# Patient Record
Sex: Male | Born: 1950
Health system: Southern US, Community
[De-identification: ages and names within clinical notes are randomized; demographics above are authoritative.]

## PROBLEM LIST (undated history)

## (undated) DIAGNOSIS — E119 Type 2 diabetes mellitus without complications: Secondary | ICD-10-CM

## (undated) DIAGNOSIS — R350 Frequency of micturition: Secondary | ICD-10-CM

## (undated) DIAGNOSIS — K7460 Unspecified cirrhosis of liver: Secondary | ICD-10-CM

## (undated) DIAGNOSIS — K219 Gastro-esophageal reflux disease without esophagitis: Secondary | ICD-10-CM

## (undated) DIAGNOSIS — E785 Hyperlipidemia, unspecified: Secondary | ICD-10-CM

## (undated) DIAGNOSIS — C649 Malignant neoplasm of unspecified kidney, except renal pelvis: Secondary | ICD-10-CM

## (undated) DIAGNOSIS — R29898 Other symptoms and signs involving the musculoskeletal system: Secondary | ICD-10-CM

## (undated) DIAGNOSIS — C61 Malignant neoplasm of prostate: Secondary | ICD-10-CM

## (undated) DIAGNOSIS — F32A Depression, unspecified: Secondary | ICD-10-CM

## (undated) DIAGNOSIS — I10 Essential (primary) hypertension: Secondary | ICD-10-CM

## (undated) DIAGNOSIS — G1221 Amyotrophic lateral sclerosis: Secondary | ICD-10-CM

## (undated) HISTORY — PX: PROSTATE BIOPSY: SHX241

## (undated) HISTORY — PX: ROTATOR CUFF REPAIR: SHX139

## (undated) HISTORY — DX: Type 2 diabetes mellitus without complications: E11.9

## (undated) HISTORY — DX: Depression, unspecified: F32.A

## (undated) HISTORY — PX: ADENOIDECTOMY, TONSILLECTOMY AND MYRINGOTOMY WITH TUBE PLACEMENT: SHX5716

## (undated) HISTORY — DX: Essential (primary) hypertension: I10

## (undated) HISTORY — DX: Hyperlipidemia, unspecified: E78.5

---

## 1992-04-08 HISTORY — PX: ROTATOR CUFF REPAIR: SHX139

## 2000-04-09 ENCOUNTER — Ambulatory Visit (HOSPITAL_BASED_OUTPATIENT_CLINIC_OR_DEPARTMENT_OTHER): Admission: RE | Admit: 2000-04-09 | Discharge: 2000-04-10 | Payer: Self-pay | Admitting: Orthopedic Surgery

## 2011-10-08 ENCOUNTER — Encounter: Payer: Self-pay | Admitting: Emergency Medicine

## 2011-10-08 ENCOUNTER — Ambulatory Visit (INDEPENDENT_AMBULATORY_CARE_PROVIDER_SITE_OTHER): Payer: BLUE CROSS/BLUE SHIELD | Admitting: Emergency Medicine

## 2011-10-08 VITALS — BP 139/81 | HR 76 | Temp 99.2°F | Resp 12 | Ht 71.5 in | Wt 223.6 lb

## 2011-10-08 DIAGNOSIS — E785 Hyperlipidemia, unspecified: Secondary | ICD-10-CM

## 2011-10-08 DIAGNOSIS — I10 Essential (primary) hypertension: Secondary | ICD-10-CM

## 2011-10-08 DIAGNOSIS — R945 Abnormal results of liver function studies: Secondary | ICD-10-CM

## 2011-10-08 DIAGNOSIS — R7989 Other specified abnormal findings of blood chemistry: Secondary | ICD-10-CM

## 2011-10-08 DIAGNOSIS — E782 Mixed hyperlipidemia: Secondary | ICD-10-CM

## 2011-10-08 LAB — COMPREHENSIVE METABOLIC PANEL
ALT: 61 U/L — ABNORMAL HIGH (ref 0–53)
AST: 50 U/L — ABNORMAL HIGH (ref 0–37)
Albumin: 4.3 g/dL (ref 3.5–5.2)
Alkaline Phosphatase: 58 U/L (ref 39–117)
BUN: 21 mg/dL (ref 6–23)
Calcium: 9.2 mg/dL (ref 8.4–10.5)
Chloride: 105 mEq/L (ref 96–112)
Creat: 0.98 mg/dL (ref 0.50–1.35)
Potassium: 3.7 mEq/L (ref 3.5–5.3)

## 2011-10-08 LAB — CBC WITH DIFFERENTIAL/PLATELET
Basophils Absolute: 0 10*3/uL (ref 0.0–0.1)
Basophils Relative: 1 % (ref 0–1)
Lymphocytes Relative: 28 % (ref 12–46)
MCHC: 35.7 g/dL (ref 30.0–36.0)
Monocytes Absolute: 0.6 10*3/uL (ref 0.1–1.0)
Neutro Abs: 5 10*3/uL (ref 1.7–7.7)
Platelets: 159 10*3/uL (ref 150–400)
RDW: 13.3 % (ref 11.5–15.5)
WBC: 8.3 10*3/uL (ref 4.0–10.5)

## 2011-10-08 LAB — LIPID PANEL
HDL: 30 mg/dL — ABNORMAL LOW (ref >39)
Total CHOL/HDL Ratio: 5.2 Ratio

## 2011-10-08 NOTE — Progress Notes (Signed)
  Subjective:    Patient ID: Albert Andrews, male    DOB: July 27, 1950, 61 y.o.   MRN: [id number]  HPI patient enters today for follow up. He was seen in November and found to have elevated liver function tests. He is currently on lisinopril HCTZ and simvastatin. He has not been very good about his diet in fact today he had 3 pieces of pizza before he came in. He does not drink or regular basis but does drink whenever he plays golf. He denies any abdominal pain. He denies any nausea or vomiting. He did see Dr. Audley Hose and have a colonoscopy. His regular checkups with the urologist regarding his prostate.    Review of Systems     Objective:   Physical Exam  Constitutional: He appears well-developed and well-nourished.  HENT:  Head: Normocephalic.  Eyes: Pupils are equal, round, and reactive to light.  Neck: No tracheal deviation present. No thyromegaly present.  Cardiovascular: Normal rate, regular rhythm, normal heart sounds and intact distal pulses.  Exam reveals no gallop.   No murmur heard. Pulmonary/Chest: No respiratory distress. He has no wheezes. He has no rales. He exhibits no tenderness.  Abdominal: He exhibits no distension and no mass. There is no tenderness. There is no rebound and no guarding.  Skin: Skin is warm and dry.          Assessment & Plan:  Patient has somewhat ruddy complected but he denies heavy alcohol intake. He does have high cholesterol has not been following diet his last cholesterol was significantly elevated . He certainly could have fatty change in the liver. We'll repeat his liver function tests. I may change him from simvastatin to Lipitor. I scheduled him for a ultrasound of the abdomen attention liver.

## 2011-10-18 ENCOUNTER — Other Ambulatory Visit: Payer: Self-pay | Admitting: Emergency Medicine

## 2011-10-18 ENCOUNTER — Ambulatory Visit
Admission: RE | Admit: 2011-10-18 | Discharge: 2011-10-18 | Disposition: A | Discharge status: Home / Self Care | Payer: BLUE CROSS/BLUE SHIELD | Source: Ambulatory Visit | Attending: Emergency Medicine | Admitting: Emergency Medicine

## 2011-10-18 DIAGNOSIS — K8689 Other specified diseases of pancreas: Secondary | ICD-10-CM

## 2011-10-18 DIAGNOSIS — R945 Abnormal results of liver function studies: Secondary | ICD-10-CM

## 2011-10-18 DIAGNOSIS — R7989 Other specified abnormal findings of blood chemistry: Secondary | ICD-10-CM

## 2011-10-22 ENCOUNTER — Other Ambulatory Visit: Payer: Self-pay | Admitting: *Deleted

## 2011-10-22 DIAGNOSIS — K8689 Other specified diseases of pancreas: Secondary | ICD-10-CM

## 2011-10-28 ENCOUNTER — Ambulatory Visit
Admission: RE | Admit: 2011-10-28 | Discharge: 2011-10-28 | Disposition: A | Discharge status: Home / Self Care | Payer: BLUE CROSS/BLUE SHIELD | Source: Ambulatory Visit | Attending: Emergency Medicine | Admitting: Emergency Medicine

## 2011-10-28 DIAGNOSIS — K8689 Other specified diseases of pancreas: Secondary | ICD-10-CM

## 2011-10-28 MED ORDER — IOHEXOL 300 MG/ML  SOLN
125.0000 mL | Freq: Once | INTRAMUSCULAR | Status: AC | PRN
  Administered 2011-10-28: 125 mL via INTRAVENOUS

## 2011-10-30 ENCOUNTER — Other Ambulatory Visit: Payer: Self-pay

## 2011-10-30 DIAGNOSIS — R7989 Other specified abnormal findings of blood chemistry: Secondary | ICD-10-CM

## 2011-10-30 DIAGNOSIS — R932 Abnormal findings on diagnostic imaging of liver and biliary tract: Secondary | ICD-10-CM

## 2011-10-31 ENCOUNTER — Ambulatory Visit (INDEPENDENT_AMBULATORY_CARE_PROVIDER_SITE_OTHER): Payer: BLUE CROSS/BLUE SHIELD | Admitting: Emergency Medicine

## 2011-10-31 VITALS — BP 126/84 | HR 82 | Temp 98.6°F | Resp 18 | Wt 224.0 lb

## 2011-10-31 DIAGNOSIS — R9389 Abnormal findings on diagnostic imaging of other specified body structures: Secondary | ICD-10-CM

## 2011-10-31 NOTE — Progress Notes (Signed)
  Subjective:    Patient ID: Albert Andrews, male    DOB: 06-10-1950, 61 y.o.   MRN: [id number]  HPI 61 year old male presents for a follow up concerning an abnormal CT scan of abdomen.   Review of Systems     Objective:   Physical Exam CT was reviewed. There is a questionable polyp in the gallbladder and a mild irregularity in the pancreas spoke with out mass. There is a cystic lesion in the kidney and it needs to be further delineated an MRI was recommended        Assessment & Plan:  MRI was ordered today. Patient can followup with Dr. Vernie Ammons regarding renal disease and Dr. Elnoria Howard regarding GI .

## 2011-11-08 NOTE — Addendum Note (Signed)
Addended by: Thelma Barge D on: 11/08/2011 11:58 AM   Modules accepted: Orders

## 2011-11-11 ENCOUNTER — Ambulatory Visit
Admission: RE | Admit: 2011-11-11 | Discharge: 2011-11-11 | Disposition: A | Discharge status: Home / Self Care | Payer: BLUE CROSS/BLUE SHIELD | Source: Ambulatory Visit | Attending: Emergency Medicine | Admitting: Emergency Medicine

## 2011-11-11 DIAGNOSIS — R9389 Abnormal findings on diagnostic imaging of other specified body structures: Secondary | ICD-10-CM

## 2011-11-11 MED ORDER — GADOBENATE DIMEGLUMINE 529 MG/ML IV SOLN
20.0000 mL | Freq: Once | INTRAVENOUS | Status: AC | PRN
  Administered 2011-11-11: 20 mL via INTRAVENOUS

## 2011-12-14 ENCOUNTER — Telehealth: Payer: Self-pay

## 2011-12-14 NOTE — Telephone Encounter (Signed)
Pt's wife needs to speak with someone regarding her husbands scan results. [phone]

## 2011-12-16 NOTE — Telephone Encounter (Signed)
Wife wants scan sent to Dr Vernie Ammons, it is faxed to him, patient has appt tomorrow

## 2012-02-12 ENCOUNTER — Telehealth: Payer: Self-pay

## 2012-02-12 MED ORDER — ATORVASTATIN CALCIUM 10 MG PO TABS: 10.0000 mg | ORAL_TABLET | Freq: Every day | ORAL | Status: DC

## 2012-02-12 NOTE — Telephone Encounter (Signed)
Pt is having side effects with the cholesteral meds and is wanting to talk with someone about either stopping the medication or changing the miligrams

## 2012-02-12 NOTE — Telephone Encounter (Signed)
With his cholesterol level might be a good idea to change medication to Lipitor 10mg  and then recheck in 6 wks vs decreasing the medication.  If he is interested please send in medication with #1 RF.

## 2012-02-12 NOTE — Telephone Encounter (Signed)
patient on simvastin 40 mg and has been reading about side effects. He is not having any but wants to know if can decrease dose so he doesn't start having side effects. Please advise.

## 2012-02-12 NOTE — Telephone Encounter (Signed)
Patient notified and voiced understanding and said agreed to do this. Send in Lipitor 10 mg 1 poqd # 30 1 refill [facility] [place].

## 2012-03-17 ENCOUNTER — Ambulatory Visit (INDEPENDENT_AMBULATORY_CARE_PROVIDER_SITE_OTHER): Payer: BLUE CROSS/BLUE SHIELD | Admitting: Emergency Medicine

## 2012-03-17 ENCOUNTER — Other Ambulatory Visit: Payer: Self-pay | Admitting: Emergency Medicine

## 2012-03-17 VITALS — BP 118/80 | HR 72 | Temp 98.1°F | Resp 16 | Ht 72.5 in | Wt 229.0 lb

## 2012-03-17 DIAGNOSIS — R9431 Abnormal electrocardiogram [ECG] [EKG]: Secondary | ICD-10-CM

## 2012-03-17 DIAGNOSIS — I10 Essential (primary) hypertension: Secondary | ICD-10-CM | POA: Insufficient documentation

## 2012-03-17 DIAGNOSIS — E785 Hyperlipidemia, unspecified: Secondary | ICD-10-CM | POA: Insufficient documentation

## 2012-03-17 LAB — CBC WITH DIFFERENTIAL/PLATELET
HCT: 47.4 % (ref 39.0–52.0)
Lymphocytes Relative: 33 % (ref 12–46)
Lymphs Abs: 2.6 10*3/uL (ref 0.7–4.0)
MCH: 32 pg (ref 26.0–34.0)
MCHC: 35.4 g/dL (ref 30.0–36.0)
MCV: 90.3 fL (ref 78.0–100.0)
Monocytes Relative: 8 % (ref 3–12)
Neutro Abs: 4.3 10*3/uL (ref 1.7–7.7)
Neutrophils Relative %: 55 % (ref 43–77)
Platelets: 175 10*3/uL (ref 150–400)
RDW: 13.5 % (ref 11.5–15.5)

## 2012-03-17 LAB — COMPREHENSIVE METABOLIC PANEL
AST: 58 U/L — ABNORMAL HIGH (ref 0–37)
Alkaline Phosphatase: 61 U/L (ref 39–117)
BUN: 18 mg/dL (ref 6–23)
Calcium: 9.4 mg/dL (ref 8.4–10.5)
Creat: 1.02 mg/dL (ref 0.50–1.35)
Glucose, Bld: 92 mg/dL (ref 70–99)

## 2012-03-17 LAB — LIPID PANEL
Cholesterol: 186 mg/dL (ref 0–200)
Total CHOL/HDL Ratio: 5.6 Ratio
Triglycerides: 364 mg/dL — ABNORMAL HIGH (ref <150)
VLDL: 73 mg/dL — ABNORMAL HIGH (ref 0–40)

## 2012-03-17 NOTE — Progress Notes (Signed)
  Subjective:    Patient ID: Albert Andrews, male    DOB: 21-Sep-1950, 61 y.o.   MRN: [id number]  HPI patient enters for followup high blood pressure and high cholesterol. He was concerned his cholesterol medication was causing memory loss and difficulties. He also felt it was causing myalgias and joint discomfort. Because of these problems he stopped his medication. He has not taken any Lipitor for about 2 weeks. He continues on his blood pressure medication. He denies chest pain shortness of breath or any other significant problems at the present time.    Review of Systems     Objective:   Physical Exam his neck is supple his chest is clear his cardiac exam is unremarkable. His abdomen is soft nontender extremities without edema.  EKG there is approximately 1 mm ST depression in the inferior leads which are nonspecific changes      Assessment & Plan:

## 2012-03-18 ENCOUNTER — Other Ambulatory Visit: Payer: Self-pay | Admitting: Emergency Medicine

## 2012-03-18 ENCOUNTER — Encounter: Payer: Self-pay | Admitting: Emergency Medicine

## 2012-03-18 DIAGNOSIS — R935 Abnormal findings on diagnostic imaging of other abdominal regions, including retroperitoneum: Secondary | ICD-10-CM | POA: Insufficient documentation

## 2012-03-18 DIAGNOSIS — R932 Abnormal findings on diagnostic imaging of liver and biliary tract: Secondary | ICD-10-CM

## 2012-03-26 ENCOUNTER — Telehealth: Payer: Self-pay | Admitting: Radiology

## 2012-03-26 NOTE — Telephone Encounter (Signed)
Patients insurance company has declined the CT scan of the Abdomen. Have put notice on your computer. Do you want to appeal?

## 2012-03-27 NOTE — Telephone Encounter (Signed)
We did he call the insurance company and let them know that was an abnormal area in the gallbladder noted on his previous scan. The radiologist recommended a short-term followup of this abnormal area to rule out a developing gallbladder cancer. We need to appeal to them and we can send him a copy of that scan result.

## 2012-03-30 NOTE — Telephone Encounter (Signed)
Have faxed Appeal. Albert Andrews

## 2012-04-14 ENCOUNTER — Telehealth: Payer: Self-pay

## 2012-04-14 ENCOUNTER — Other Ambulatory Visit: Payer: Self-pay | Admitting: Emergency Medicine

## 2012-04-14 NOTE — Telephone Encounter (Signed)
Appeals dept CB and reported that they had let Albert Andrews know that CT was approved on 03/31/12 and is good for 30 days. The auth # [id number]. I LMOM on Referrals VM w/this info in case they didn't have it.

## 2012-04-14 NOTE — Telephone Encounter (Signed)
I called [employer] to check the status of the appeal (letter faxed in from Dr Cleta Alberts on 03/30/12, see previous message). I was transferred to the Appeals Department and had to [facility] for them to return my call w/status of appeal. Appeal letter is in [place] pending box.

## 2012-05-07 ENCOUNTER — Ambulatory Visit (INDEPENDENT_AMBULATORY_CARE_PROVIDER_SITE_OTHER): Payer: BLUE CROSS/BLUE SHIELD | Admitting: Emergency Medicine

## 2012-05-07 ENCOUNTER — Ambulatory Visit: Payer: BLUE CROSS/BLUE SHIELD

## 2012-05-07 VITALS — BP 108/73 | HR 79 | Temp 98.6°F | Resp 17 | Ht 72.0 in | Wt 221.0 lb

## 2012-05-07 DIAGNOSIS — M109 Gout, unspecified: Secondary | ICD-10-CM

## 2012-05-07 DIAGNOSIS — M79671 Pain in right foot: Secondary | ICD-10-CM

## 2012-05-07 DIAGNOSIS — M79609 Pain in unspecified limb: Secondary | ICD-10-CM

## 2012-05-07 LAB — POCT CBC
HCT, POC: 52.4 % (ref 43.5–53.7)
Lymph, poc: 2.5 (ref 0.6–3.4)
MCH, POC: 31.7 pg — AB (ref 27–31.2)
MCHC: 33 g/dL (ref 31.8–35.4)
POC Granulocyte: 8.9 — AB (ref 2–6.9)
POC LYMPH PERCENT: 20.9 %L (ref 10–50)
POC MID %: 6.4 %M (ref 0–12)
RDW, POC: 13.3 %
WBC: 12.2 10*3/uL — AB (ref 4.6–10.2)

## 2012-05-07 LAB — URIC ACID: Uric Acid, Serum: 6.1 mg/dL (ref 4.0–7.8)

## 2012-05-07 LAB — BASIC METABOLIC PANEL
BUN: 21 mg/dL (ref 6–23)
CO2: 26 mEq/L (ref 19–32)
Calcium: 10 mg/dL (ref 8.4–10.5)
Chloride: 104 mEq/L (ref 96–112)
Creat: 1.43 mg/dL — ABNORMAL HIGH (ref 0.50–1.35)
Glucose, Bld: 114 mg/dL — ABNORMAL HIGH (ref 70–99)

## 2012-05-07 MED ORDER — COLCHICINE 0.6 MG PO TABS: ORAL_TABLET | ORAL | Status: DC

## 2012-05-07 MED ORDER — HYDROCODONE-ACETAMINOPHEN 5-325 MG PO TABS: 1.0000 | ORAL_TABLET | Freq: Four times a day (QID) | ORAL | Status: DC | PRN

## 2012-05-07 NOTE — Patient Instructions (Addendum)
Gout Gout is an inflammatory condition (arthritis) caused by a buildup of uric acid crystals in the joints. Uric acid is a chemical that is normally present in the blood. Under some circumstances, uric acid can form into crystals in your joints. This causes joint redness, soreness, and swelling (inflammation). Repeat attacks are common. Over time, uric acid crystals can form into masses (tophi) near a joint, causing disfigurement. Gout is treatable and often preventable. CAUSES  The disease begins with elevated levels of uric acid in the blood. Uric acid is produced by your body when it breaks down a naturally found substance called purines. This also happens when you eat certain foods such as meats and fish. Causes of an elevated uric acid level include:  Being passed down from parent to child (heredity).  Diseases that cause increased uric acid production (obesity, psoriasis, some cancers).  Excessive alcohol use.  Diet, especially diets rich in meat and seafood.  Medicines, including certain cancer-fighting drugs (chemotherapy), diuretics, and aspirin.  Chronic kidney disease. The kidneys are no longer able to remove uric acid well.  Problems with metabolism. Conditions strongly associated with gout include:  Obesity.  High blood pressure.  High cholesterol.  Diabetes. Not everyone with elevated uric acid levels gets gout. It is not understood why some people get gout and others do not. Surgery, joint injury, and eating too much of certain foods are some of the factors that can lead to gout. SYMPTOMS   An attack of gout comes on quickly. It causes intense pain with redness, swelling, and warmth in a joint.  Fever can occur.  Often, only one joint is involved. Certain joints are more commonly involved:  Base of the big toe.  Knee.  Ankle.  Wrist.  Finger. Without treatment, an attack usually goes away in a few days to weeks. Between attacks, you usually will not have  symptoms, which is different from many other forms of arthritis. DIAGNOSIS  Your caregiver will suspect gout based on your symptoms and exam. Removal of fluid from the joint (arthrocentesis) is done to check for uric acid crystals. Your caregiver will give you a medicine that numbs the area (local anesthetic) and use a needle to remove joint fluid for exam. Gout is confirmed when uric acid crystals are seen in joint fluid, using a special microscope. Sometimes, blood, urine, and X-ray tests are also used. TREATMENT  There are 2 phases to gout treatment: treating the sudden onset (acute) attack and preventing attacks (prophylaxis). Treatment of an Acute Attack  Medicines are used. These include anti-inflammatory medicines or steroid medicines.  An injection of steroid medicine into the affected joint is sometimes necessary.  The painful joint is rested. Movement can worsen the arthritis.  You may use warm or cold treatments on painful joints, depending which works best for you.  Discuss the use of coffee, vitamin C, or cherries with your caregiver. These may be helpful treatment options. Treatment to Prevent Attacks After the acute attack subsides, your caregiver may advise prophylactic medicine. These medicines either help your kidneys eliminate uric acid from your body or decrease your uric acid production. You may need to stay on these medicines for a very long time. The early phase of treatment with prophylactic medicine can be associated with an increase in acute gout attacks. For this reason, during the first few months of treatment, your caregiver may also advise you to take medicines usually used for acute gout treatment. Be sure you understand your caregiver's directions.   You should also discuss dietary treatment with your caregiver. Certain foods such as meats and fish can increase uric acid levels. Other foods such as dairy can decrease levels. Your caregiver can give you a list of foods  to avoid. HOME CARE INSTRUCTIONS   Do not take aspirin to relieve pain. This raises uric acid levels.  Only take over-the-counter or prescription medicines for pain, discomfort, or fever as directed by your caregiver.  Rest the joint as much as possible. When in bed, keep sheets and blankets off painful areas.  Keep the affected joint raised (elevated).  Use crutches if the painful joint is in your leg.  Drink enough water and fluids to keep your urine clear or pale yellow. This helps your body get rid of uric acid. Do not drink alcoholic beverages. They slow the passage of uric acid.  Follow your caregiver's dietary instructions. Pay careful attention to the amount of protein you eat. Your daily diet should emphasize fruits, vegetables, whole grains, and fat-free or low-fat milk products.  Maintain a healthy body weight. SEEK MEDICAL CARE IF:   You have an oral temperature above 102 F (38.9 C).  You develop diarrhea, vomiting, or any side effects from medicines.  You do not feel better in 24 hours, or you are getting worse. SEEK IMMEDIATE MEDICAL CARE IF:   Your joint becomes suddenly more tender and you have:  Chills.  An oral temperature above 102 F (38.9 C), not controlled by medicine. MAKE SURE YOU:   Understand these instructions.  Will watch your condition.  Will get help right away if you are not doing well or get worse. Document Released: 03/22/2000 Document Revised: 06/17/2011 Document Reviewed: 07/03/2009 Exit[employer] Patient Information 2013 [employer].    

## 2012-05-07 NOTE — Progress Notes (Signed)
  Subjective:    Patient ID: Albert Andrews, male    DOB: 1950/09/04, 62 y.o.   MRN: [id number]  HPI Pt comes in today for foot pain in his right great toe that started Tuesday. By Wednesday he was unable to walk. Today he reports it is feeling better. This is the third occurrence but first time he is seeking medical care for it. He is suspicious it may be gout. He has not been diagnosed with gout before.    Review of Systems     Objective:   Physical Exam is tenderness over the MTP joint of the great toe. There is mild tenderness over the IP joint of the great toe both of the right foot. There is no tenderness over the ankle.  Results for orders placed in visit on 05/07/12  POCT CBC      Component Value Range   WBC 12.2 (*) 4.6 - 10.2 K/uL   Lymph, poc 2.5  0.6 - 3.4   POC LYMPH PERCENT 20.9  10 - 50 %L   MID (cbc) 0.8  0 - 0.9   POC MID % 6.4  0 - 12 %M   POC Granulocyte 8.9 (*) 2 - 6.9   Granulocyte percent 72.7  37 - 80 %G   RBC 5.46  4.69 - 6.13 M/uL   Hemoglobin 17.3  14.1 - 18.1 g/dL   HCT, POC 24.4  01.0 - 53.7 %   MCV 95.9  80 - 97 fL   MCH, POC 31.7 (*) 27 - 31.2 pg   MCHC 33.0  31.8 - 35.4 g/dL   RDW, POC 27.2     Platelet Count, POC 206  142 - 424 K/uL   MPV 10.8  0 - 99.8 fL   [facility] reading (PRIMARY) by  Dr. Cleta Alberts there are degenerative changes at the MTP joint of the great toe and IP joint. There is a radiolucent area present medially at the base of the proximal phalanx consistent with a gouty tophi       Assessment & Plan:  Patient's white count is up slightly but x-ray shows mainly degenerative changes and questionable tophi. His physical exam is most consistent with gout. We'll treat with pain medications and colchicine. Uric acid is pending.

## 2013-07-31 ENCOUNTER — Other Ambulatory Visit: Payer: Self-pay | Admitting: Emergency Medicine

## 2013-07-31 ENCOUNTER — Ambulatory Visit (INDEPENDENT_AMBULATORY_CARE_PROVIDER_SITE_OTHER): Payer: BLUE CROSS/BLUE SHIELD | Admitting: Emergency Medicine

## 2013-07-31 VITALS — BP 136/88 | HR 68 | Temp 98.2°F | Resp 18 | Ht 72.0 in | Wt 229.0 lb

## 2013-07-31 DIAGNOSIS — E785 Hyperlipidemia, unspecified: Secondary | ICD-10-CM

## 2013-07-31 DIAGNOSIS — I10 Essential (primary) hypertension: Secondary | ICD-10-CM

## 2013-07-31 LAB — COMPREHENSIVE METABOLIC PANEL
ALT: 79 U/L — AB (ref 0–53)
AST: 58 U/L — AB (ref 0–37)
Albumin: 4.6 g/dL (ref 3.5–5.2)
Alkaline Phosphatase: 81 U/L (ref 39–117)
BILIRUBIN TOTAL: 0.9 mg/dL (ref 0.2–1.2)
BUN: 23 mg/dL (ref 6–23)
CO2: 26 mEq/L (ref 19–32)
CREATININE: 1 mg/dL (ref 0.50–1.35)
Calcium: 9.1 mg/dL (ref 8.4–10.5)
Chloride: 103 mEq/L (ref 96–112)
Glucose, Bld: 149 mg/dL — ABNORMAL HIGH (ref 70–99)
Potassium: 4 mEq/L (ref 3.5–5.3)
Sodium: 139 mEq/L (ref 135–145)
Total Protein: 7.5 g/dL (ref 6.0–8.3)

## 2013-07-31 LAB — POCT CBC
Granulocyte percent: 64.7 %G (ref 37–80)
HCT, POC: 39.2 % — AB (ref 43.5–53.7)
Hemoglobin: 12.8 g/dL — AB (ref 14.1–18.1)
LYMPH, POC: 1.7 (ref 0.6–3.4)
MCH: 30.9 pg (ref 27–31.2)
MCHC: 32.7 g/dL (ref 31.8–35.4)
MCV: 94.8 fL (ref 80–97)
MID (CBC): 0.3 (ref 0–0.9)
MPV: 9.7 fL (ref 0–99.8)
PLATELET COUNT, POC: 132 10*3/uL — AB (ref 142–424)
POC Granulocyte: 3.8 (ref 2–6.9)
POC LYMPH PERCENT: 29.6 %L (ref 10–50)
POC MID %: 5.7 % (ref 0–12)
RBC: 4.14 M/uL — AB (ref 4.69–6.13)
RDW, POC: 13.1 %
WBC: 5.9 10*3/uL (ref 4.6–10.2)

## 2013-07-31 LAB — LIPID PANEL
CHOL/HDL RATIO: 6.4 ratio
CHOLESTEROL: 210 mg/dL — AB (ref 0–200)
HDL: 33 mg/dL — ABNORMAL LOW (ref >39)
LDL Cholesterol: 112 mg/dL — ABNORMAL HIGH (ref 0–99)
Triglycerides: 327 mg/dL — ABNORMAL HIGH (ref <150)
VLDL: 65 mg/dL — AB (ref 0–40)

## 2013-07-31 MED ORDER — LISINOPRIL 20 MG PO TABS: 20.0000 mg | ORAL_TABLET | Freq: Every day | ORAL | Status: DC

## 2013-07-31 NOTE — Progress Notes (Signed)
   Subjective:    Patient ID: Albert Andrews, male    DOB: 05/22/1950, 63 y.o.   MRN: [id number]  HPI Pt here for bp medication refill. Last dose 6 month ago, pt did hurt his right arm and went to the clinic where they recommended him to start his medication again bp at other clinic 190/138. Pt is not checking bp at home. Denies chest pain, blurry vision.     Review of Systems     Objective:   Physical Exam  Blood pressure taken during exam: Right arm 150/90, Left arm 164/94      Assessment & Plan:

## 2013-07-31 NOTE — Patient Instructions (Signed)

## 2013-08-01 LAB — URIC ACID: URIC ACID, SERUM: 5.8 mg/dL (ref 4.0–7.8)

## 2013-08-13 ENCOUNTER — Telehealth: Payer: Self-pay | Admitting: *Deleted

## 2013-08-13 NOTE — Telephone Encounter (Signed)
Pt called back concerning letter received in mail about lab results. Made him aware of results and sent copy to patient .

## 2014-10-29 ENCOUNTER — Other Ambulatory Visit: Payer: Self-pay | Admitting: Emergency Medicine

## 2014-11-07 ENCOUNTER — Telehealth: Payer: Self-pay

## 2014-11-07 ENCOUNTER — Other Ambulatory Visit: Payer: Self-pay | Admitting: Emergency Medicine

## 2014-11-07 NOTE — Telephone Encounter (Signed)
PATIENT'S GIRLFRIEND (AMY) STATES THEY CALLED [facility] ABOUT 5 DAYS AGO TO GET A REFILL ON LISINOPRIL 20 MG. SHE DOES NOT THINK THEY EVER CONTACTED [place]. THEY ARE GOING TO [place] Tuesday AND REALLY NEEDS IT AS SOON AS POSSIBLE.  BEST PHONE [phone] (AMY BRADY - GIRLFRIEND) THEY WILL TRY TO GET IT TRANSFERRED TO A [facility] IN [place] ONCE WE CALL AND LET THEM KNOW IT HAS BEEN FILLED.  [place]

## 2014-11-08 NOTE — Telephone Encounter (Signed)
RF was sent in yesterday. Called Amy and gave her and pt, who was also listening, info about RF and also that pt is VERY overdue to be seen and needs to get in before next RF is needed. Amy stated she will make sure he gets in.

## 2015-01-10 ENCOUNTER — Encounter: Payer: Self-pay | Admitting: Emergency Medicine

## 2015-05-30 DIAGNOSIS — H2513 Age-related nuclear cataract, bilateral: Secondary | ICD-10-CM | POA: Diagnosis not present

## 2015-05-30 DIAGNOSIS — H353111 Nonexudative age-related macular degeneration, right eye, early dry stage: Secondary | ICD-10-CM | POA: Diagnosis not present

## 2015-07-24 DIAGNOSIS — H4911 Fourth [trochlear] nerve palsy, right eye: Secondary | ICD-10-CM | POA: Diagnosis not present

## 2016-05-29 DIAGNOSIS — H353111 Nonexudative age-related macular degeneration, right eye, early dry stage: Secondary | ICD-10-CM | POA: Diagnosis not present

## 2016-05-29 DIAGNOSIS — H2513 Age-related nuclear cataract, bilateral: Secondary | ICD-10-CM | POA: Diagnosis not present

## 2016-05-29 DIAGNOSIS — H4911 Fourth [trochlear] nerve palsy, right eye: Secondary | ICD-10-CM | POA: Diagnosis not present

## 2016-06-06 DIAGNOSIS — H2513 Age-related nuclear cataract, bilateral: Secondary | ICD-10-CM | POA: Diagnosis not present

## 2016-06-06 DIAGNOSIS — H4911 Fourth [trochlear] nerve palsy, right eye: Secondary | ICD-10-CM | POA: Diagnosis not present

## 2016-06-17 DIAGNOSIS — M9903 Segmental and somatic dysfunction of lumbar region: Secondary | ICD-10-CM | POA: Diagnosis not present

## 2016-06-17 DIAGNOSIS — M5441 Lumbago with sciatica, right side: Secondary | ICD-10-CM | POA: Diagnosis not present

## 2016-06-18 DIAGNOSIS — M9903 Segmental and somatic dysfunction of lumbar region: Secondary | ICD-10-CM | POA: Diagnosis not present

## 2016-06-18 DIAGNOSIS — M5441 Lumbago with sciatica, right side: Secondary | ICD-10-CM | POA: Diagnosis not present

## 2016-06-19 DIAGNOSIS — M9903 Segmental and somatic dysfunction of lumbar region: Secondary | ICD-10-CM | POA: Diagnosis not present

## 2016-06-19 DIAGNOSIS — M5441 Lumbago with sciatica, right side: Secondary | ICD-10-CM | POA: Diagnosis not present

## 2016-06-20 DIAGNOSIS — M9903 Segmental and somatic dysfunction of lumbar region: Secondary | ICD-10-CM | POA: Diagnosis not present

## 2016-06-20 DIAGNOSIS — M5441 Lumbago with sciatica, right side: Secondary | ICD-10-CM | POA: Diagnosis not present

## 2016-06-24 DIAGNOSIS — M5441 Lumbago with sciatica, right side: Secondary | ICD-10-CM | POA: Diagnosis not present

## 2016-06-24 DIAGNOSIS — M9903 Segmental and somatic dysfunction of lumbar region: Secondary | ICD-10-CM | POA: Diagnosis not present

## 2016-06-25 DIAGNOSIS — M9903 Segmental and somatic dysfunction of lumbar region: Secondary | ICD-10-CM | POA: Diagnosis not present

## 2016-06-25 DIAGNOSIS — M5441 Lumbago with sciatica, right side: Secondary | ICD-10-CM | POA: Diagnosis not present

## 2016-06-26 DIAGNOSIS — M9903 Segmental and somatic dysfunction of lumbar region: Secondary | ICD-10-CM | POA: Diagnosis not present

## 2016-06-26 DIAGNOSIS — M5441 Lumbago with sciatica, right side: Secondary | ICD-10-CM | POA: Diagnosis not present

## 2016-07-01 DIAGNOSIS — M9903 Segmental and somatic dysfunction of lumbar region: Secondary | ICD-10-CM | POA: Diagnosis not present

## 2016-07-01 DIAGNOSIS — M5441 Lumbago with sciatica, right side: Secondary | ICD-10-CM | POA: Diagnosis not present

## 2016-07-02 DIAGNOSIS — M545 Low back pain: Secondary | ICD-10-CM | POA: Diagnosis not present

## 2016-07-03 DIAGNOSIS — M9903 Segmental and somatic dysfunction of lumbar region: Secondary | ICD-10-CM | POA: Diagnosis not present

## 2016-07-03 DIAGNOSIS — M5441 Lumbago with sciatica, right side: Secondary | ICD-10-CM | POA: Diagnosis not present

## 2016-07-08 ENCOUNTER — Other Ambulatory Visit: Payer: Self-pay | Admitting: Sports Medicine

## 2016-07-08 DIAGNOSIS — M545 Low back pain: Secondary | ICD-10-CM

## 2016-07-08 DIAGNOSIS — M5441 Lumbago with sciatica, right side: Secondary | ICD-10-CM | POA: Diagnosis not present

## 2016-07-08 DIAGNOSIS — M9903 Segmental and somatic dysfunction of lumbar region: Secondary | ICD-10-CM | POA: Diagnosis not present

## 2016-07-10 DIAGNOSIS — M5441 Lumbago with sciatica, right side: Secondary | ICD-10-CM | POA: Diagnosis not present

## 2016-07-10 DIAGNOSIS — M9903 Segmental and somatic dysfunction of lumbar region: Secondary | ICD-10-CM | POA: Diagnosis not present

## 2016-07-15 DIAGNOSIS — M9903 Segmental and somatic dysfunction of lumbar region: Secondary | ICD-10-CM | POA: Diagnosis not present

## 2016-07-15 DIAGNOSIS — M5441 Lumbago with sciatica, right side: Secondary | ICD-10-CM | POA: Diagnosis not present

## 2016-07-17 ENCOUNTER — Ambulatory Visit
Admission: RE | Admit: 2016-07-17 | Discharge: 2016-07-17 | Disposition: A | Discharge status: Home / Self Care | Payer: PPO | Source: Ambulatory Visit | Attending: Sports Medicine | Admitting: Sports Medicine

## 2016-07-17 DIAGNOSIS — M545 Low back pain: Secondary | ICD-10-CM

## 2016-07-17 DIAGNOSIS — M48061 Spinal stenosis, lumbar region without neurogenic claudication: Secondary | ICD-10-CM | POA: Diagnosis not present

## 2016-07-18 DIAGNOSIS — M5106 Intervertebral disc disorders with myelopathy, lumbar region: Secondary | ICD-10-CM | POA: Diagnosis not present

## 2016-07-18 DIAGNOSIS — M9903 Segmental and somatic dysfunction of lumbar region: Secondary | ICD-10-CM | POA: Diagnosis not present

## 2016-07-18 DIAGNOSIS — M5441 Lumbago with sciatica, right side: Secondary | ICD-10-CM | POA: Diagnosis not present

## 2016-07-22 DIAGNOSIS — M545 Low back pain: Secondary | ICD-10-CM | POA: Diagnosis not present

## 2016-07-22 DIAGNOSIS — M5126 Other intervertebral disc displacement, lumbar region: Secondary | ICD-10-CM | POA: Diagnosis not present

## 2016-11-07 DIAGNOSIS — H2513 Age-related nuclear cataract, bilateral: Secondary | ICD-10-CM | POA: Diagnosis not present

## 2016-11-07 DIAGNOSIS — H4911 Fourth [trochlear] nerve palsy, right eye: Secondary | ICD-10-CM | POA: Diagnosis not present

## 2016-11-12 DIAGNOSIS — M722 Plantar fascial fibromatosis: Secondary | ICD-10-CM | POA: Diagnosis not present

## 2016-11-12 DIAGNOSIS — M7732 Calcaneal spur, left foot: Secondary | ICD-10-CM | POA: Diagnosis not present

## 2016-11-12 DIAGNOSIS — M71572 Other bursitis, not elsewhere classified, left ankle and foot: Secondary | ICD-10-CM | POA: Diagnosis not present

## 2016-11-13 DIAGNOSIS — H2513 Age-related nuclear cataract, bilateral: Secondary | ICD-10-CM | POA: Diagnosis not present

## 2016-11-13 DIAGNOSIS — H4911 Fourth [trochlear] nerve palsy, right eye: Secondary | ICD-10-CM | POA: Diagnosis not present

## 2016-11-20 DIAGNOSIS — H2513 Age-related nuclear cataract, bilateral: Secondary | ICD-10-CM | POA: Diagnosis not present

## 2016-11-20 DIAGNOSIS — M71572 Other bursitis, not elsewhere classified, left ankle and foot: Secondary | ICD-10-CM | POA: Diagnosis not present

## 2016-11-20 DIAGNOSIS — M722 Plantar fascial fibromatosis: Secondary | ICD-10-CM | POA: Diagnosis not present

## 2016-11-20 DIAGNOSIS — H4911 Fourth [trochlear] nerve palsy, right eye: Secondary | ICD-10-CM | POA: Diagnosis not present

## 2016-12-04 DIAGNOSIS — M71572 Other bursitis, not elsewhere classified, left ankle and foot: Secondary | ICD-10-CM | POA: Diagnosis not present

## 2016-12-04 DIAGNOSIS — M722 Plantar fascial fibromatosis: Secondary | ICD-10-CM | POA: Diagnosis not present

## 2017-01-14 DIAGNOSIS — M722 Plantar fascial fibromatosis: Secondary | ICD-10-CM | POA: Diagnosis not present

## 2017-01-15 DIAGNOSIS — M25672 Stiffness of left ankle, not elsewhere classified: Secondary | ICD-10-CM | POA: Diagnosis not present

## 2017-01-15 DIAGNOSIS — M25572 Pain in left ankle and joints of left foot: Secondary | ICD-10-CM | POA: Diagnosis not present

## 2017-01-15 DIAGNOSIS — M25675 Stiffness of left foot, not elsewhere classified: Secondary | ICD-10-CM | POA: Diagnosis not present

## 2017-01-15 DIAGNOSIS — R262 Difficulty in walking, not elsewhere classified: Secondary | ICD-10-CM | POA: Diagnosis not present

## 2017-01-17 DIAGNOSIS — M25672 Stiffness of left ankle, not elsewhere classified: Secondary | ICD-10-CM | POA: Diagnosis not present

## 2017-01-17 DIAGNOSIS — R262 Difficulty in walking, not elsewhere classified: Secondary | ICD-10-CM | POA: Diagnosis not present

## 2017-01-17 DIAGNOSIS — M25572 Pain in left ankle and joints of left foot: Secondary | ICD-10-CM | POA: Diagnosis not present

## 2017-01-17 DIAGNOSIS — M25675 Stiffness of left foot, not elsewhere classified: Secondary | ICD-10-CM | POA: Diagnosis not present

## 2017-01-20 DIAGNOSIS — M25672 Stiffness of left ankle, not elsewhere classified: Secondary | ICD-10-CM | POA: Diagnosis not present

## 2017-01-20 DIAGNOSIS — R262 Difficulty in walking, not elsewhere classified: Secondary | ICD-10-CM | POA: Diagnosis not present

## 2017-01-20 DIAGNOSIS — M25675 Stiffness of left foot, not elsewhere classified: Secondary | ICD-10-CM | POA: Diagnosis not present

## 2017-01-20 DIAGNOSIS — M25572 Pain in left ankle and joints of left foot: Secondary | ICD-10-CM | POA: Diagnosis not present

## 2017-01-22 DIAGNOSIS — R262 Difficulty in walking, not elsewhere classified: Secondary | ICD-10-CM | POA: Diagnosis not present

## 2017-01-22 DIAGNOSIS — M25572 Pain in left ankle and joints of left foot: Secondary | ICD-10-CM | POA: Diagnosis not present

## 2017-01-22 DIAGNOSIS — M25672 Stiffness of left ankle, not elsewhere classified: Secondary | ICD-10-CM | POA: Diagnosis not present

## 2017-01-22 DIAGNOSIS — M25675 Stiffness of left foot, not elsewhere classified: Secondary | ICD-10-CM | POA: Diagnosis not present

## 2017-01-29 DIAGNOSIS — M25675 Stiffness of left foot, not elsewhere classified: Secondary | ICD-10-CM | POA: Diagnosis not present

## 2017-01-29 DIAGNOSIS — M25572 Pain in left ankle and joints of left foot: Secondary | ICD-10-CM | POA: Diagnosis not present

## 2017-01-29 DIAGNOSIS — R262 Difficulty in walking, not elsewhere classified: Secondary | ICD-10-CM | POA: Diagnosis not present

## 2017-01-29 DIAGNOSIS — M25672 Stiffness of left ankle, not elsewhere classified: Secondary | ICD-10-CM | POA: Diagnosis not present

## 2017-03-04 DIAGNOSIS — H2513 Age-related nuclear cataract, bilateral: Secondary | ICD-10-CM | POA: Diagnosis not present

## 2017-03-04 DIAGNOSIS — Z8669 Personal history of other diseases of the nervous system and sense organs: Secondary | ICD-10-CM | POA: Diagnosis not present

## 2017-06-02 DIAGNOSIS — H2513 Age-related nuclear cataract, bilateral: Secondary | ICD-10-CM | POA: Diagnosis not present

## 2017-06-02 DIAGNOSIS — H353111 Nonexudative age-related macular degeneration, right eye, early dry stage: Secondary | ICD-10-CM | POA: Diagnosis not present

## 2017-06-02 DIAGNOSIS — H4911 Fourth [trochlear] nerve palsy, right eye: Secondary | ICD-10-CM | POA: Diagnosis not present

## 2017-07-09 DIAGNOSIS — S92335A Nondisplaced fracture of third metatarsal bone, left foot, initial encounter for closed fracture: Secondary | ICD-10-CM | POA: Diagnosis not present

## 2017-07-23 DIAGNOSIS — S92335D Nondisplaced fracture of third metatarsal bone, left foot, subsequent encounter for fracture with routine healing: Secondary | ICD-10-CM | POA: Diagnosis not present

## 2017-08-20 DIAGNOSIS — S92335D Nondisplaced fracture of third metatarsal bone, left foot, subsequent encounter for fracture with routine healing: Secondary | ICD-10-CM | POA: Diagnosis not present

## 2017-09-08 DIAGNOSIS — S92335D Nondisplaced fracture of third metatarsal bone, left foot, subsequent encounter for fracture with routine healing: Secondary | ICD-10-CM | POA: Diagnosis not present

## 2017-10-20 DIAGNOSIS — S92335D Nondisplaced fracture of third metatarsal bone, left foot, subsequent encounter for fracture with routine healing: Secondary | ICD-10-CM | POA: Diagnosis not present

## 2017-12-01 DIAGNOSIS — S92335D Nondisplaced fracture of third metatarsal bone, left foot, subsequent encounter for fracture with routine healing: Secondary | ICD-10-CM | POA: Diagnosis not present

## 2017-12-01 DIAGNOSIS — M7752 Other enthesopathy of left foot: Secondary | ICD-10-CM | POA: Diagnosis not present

## 2018-03-28 DIAGNOSIS — M6281 Muscle weakness (generalized): Secondary | ICD-10-CM | POA: Diagnosis not present

## 2018-04-02 ENCOUNTER — Other Ambulatory Visit: Payer: Self-pay | Admitting: Sports Medicine

## 2018-04-02 DIAGNOSIS — M545 Low back pain, unspecified: Secondary | ICD-10-CM

## 2018-04-02 DIAGNOSIS — M5416 Radiculopathy, lumbar region: Secondary | ICD-10-CM | POA: Diagnosis not present

## 2018-04-05 ENCOUNTER — Ambulatory Visit
Admission: RE | Admit: 2018-04-05 | Discharge: 2018-04-05 | Disposition: A | Discharge status: Home / Self Care | Payer: PPO | Source: Ambulatory Visit | Attending: Sports Medicine | Admitting: Sports Medicine

## 2018-04-05 DIAGNOSIS — M48061 Spinal stenosis, lumbar region without neurogenic claudication: Secondary | ICD-10-CM | POA: Diagnosis not present

## 2018-04-05 DIAGNOSIS — M545 Low back pain, unspecified: Secondary | ICD-10-CM

## 2018-04-09 DIAGNOSIS — M5416 Radiculopathy, lumbar region: Secondary | ICD-10-CM | POA: Diagnosis not present

## 2018-04-10 ENCOUNTER — Other Ambulatory Visit: Payer: PPO

## 2018-04-13 DIAGNOSIS — N281 Cyst of kidney, acquired: Secondary | ICD-10-CM | POA: Diagnosis not present

## 2018-04-13 DIAGNOSIS — R972 Elevated prostate specific antigen [PSA]: Secondary | ICD-10-CM | POA: Diagnosis not present

## 2018-04-17 DIAGNOSIS — R7309 Other abnormal glucose: Secondary | ICD-10-CM | POA: Diagnosis not present

## 2018-04-17 DIAGNOSIS — R253 Fasciculation: Secondary | ICD-10-CM | POA: Diagnosis not present

## 2018-04-17 DIAGNOSIS — M62561 Muscle wasting and atrophy, not elsewhere classified, right lower leg: Secondary | ICD-10-CM | POA: Diagnosis not present

## 2018-04-17 DIAGNOSIS — R29898 Other symptoms and signs involving the musculoskeletal system: Secondary | ICD-10-CM | POA: Diagnosis not present

## 2018-04-24 DIAGNOSIS — Z1389 Encounter for screening for other disorder: Secondary | ICD-10-CM | POA: Diagnosis not present

## 2018-04-24 DIAGNOSIS — R29898 Other symptoms and signs involving the musculoskeletal system: Secondary | ICD-10-CM | POA: Diagnosis not present

## 2018-04-24 DIAGNOSIS — R972 Elevated prostate specific antigen [PSA]: Secondary | ICD-10-CM | POA: Diagnosis not present

## 2018-04-24 DIAGNOSIS — E1169 Type 2 diabetes mellitus with other specified complication: Secondary | ICD-10-CM | POA: Diagnosis not present

## 2018-04-24 DIAGNOSIS — J309 Allergic rhinitis, unspecified: Secondary | ICD-10-CM | POA: Diagnosis not present

## 2018-04-24 DIAGNOSIS — M519 Unspecified thoracic, thoracolumbar and lumbosacral intervertebral disc disorder: Secondary | ICD-10-CM | POA: Diagnosis not present

## 2018-04-24 DIAGNOSIS — E785 Hyperlipidemia, unspecified: Secondary | ICD-10-CM | POA: Diagnosis not present

## 2018-04-30 DIAGNOSIS — R972 Elevated prostate specific antigen [PSA]: Secondary | ICD-10-CM | POA: Diagnosis not present

## 2018-04-30 DIAGNOSIS — N400 Enlarged prostate without lower urinary tract symptoms: Secondary | ICD-10-CM | POA: Diagnosis not present

## 2018-05-05 DIAGNOSIS — M79604 Pain in right leg: Secondary | ICD-10-CM | POA: Diagnosis not present

## 2018-05-05 DIAGNOSIS — M545 Low back pain: Secondary | ICD-10-CM | POA: Diagnosis not present

## 2018-05-05 DIAGNOSIS — R262 Difficulty in walking, not elsewhere classified: Secondary | ICD-10-CM | POA: Diagnosis not present

## 2018-05-11 DIAGNOSIS — R7303 Prediabetes: Secondary | ICD-10-CM | POA: Diagnosis not present

## 2018-05-11 DIAGNOSIS — M25551 Pain in right hip: Secondary | ICD-10-CM | POA: Diagnosis not present

## 2018-05-11 DIAGNOSIS — M6281 Muscle weakness (generalized): Secondary | ICD-10-CM | POA: Diagnosis not present

## 2018-05-11 DIAGNOSIS — M6250 Muscle wasting and atrophy, not elsewhere classified, unspecified site: Secondary | ICD-10-CM | POA: Diagnosis not present

## 2018-05-11 DIAGNOSIS — G6289 Other specified polyneuropathies: Secondary | ICD-10-CM | POA: Diagnosis not present

## 2018-05-12 DIAGNOSIS — M25551 Pain in right hip: Secondary | ICD-10-CM | POA: Insufficient documentation

## 2018-05-12 DIAGNOSIS — M62561 Muscle wasting and atrophy, not elsewhere classified, right lower leg: Secondary | ICD-10-CM | POA: Insufficient documentation

## 2018-05-12 DIAGNOSIS — G629 Polyneuropathy, unspecified: Secondary | ICD-10-CM | POA: Diagnosis not present

## 2018-05-12 DIAGNOSIS — R29898 Other symptoms and signs involving the musculoskeletal system: Secondary | ICD-10-CM | POA: Insufficient documentation

## 2018-05-12 DIAGNOSIS — G959 Disease of spinal cord, unspecified: Secondary | ICD-10-CM | POA: Diagnosis not present

## 2018-05-12 DIAGNOSIS — R253 Fasciculation: Secondary | ICD-10-CM | POA: Insufficient documentation

## 2018-05-13 DIAGNOSIS — R634 Abnormal weight loss: Secondary | ICD-10-CM | POA: Diagnosis not present

## 2018-05-13 DIAGNOSIS — R29898 Other symptoms and signs involving the musculoskeletal system: Secondary | ICD-10-CM | POA: Diagnosis not present

## 2018-05-13 DIAGNOSIS — R2689 Other abnormalities of gait and mobility: Secondary | ICD-10-CM | POA: Diagnosis not present

## 2018-05-13 DIAGNOSIS — G1221 Amyotrophic lateral sclerosis: Secondary | ICD-10-CM | POA: Diagnosis not present

## 2018-05-13 DIAGNOSIS — N281 Cyst of kidney, acquired: Secondary | ICD-10-CM | POA: Diagnosis not present

## 2018-05-13 DIAGNOSIS — E119 Type 2 diabetes mellitus without complications: Secondary | ICD-10-CM | POA: Diagnosis not present

## 2018-05-13 DIAGNOSIS — Z6825 Body mass index (BMI) 25.0-25.9, adult: Secondary | ICD-10-CM | POA: Diagnosis not present

## 2018-05-14 ENCOUNTER — Other Ambulatory Visit: Payer: Self-pay | Admitting: Neurosurgery

## 2018-05-14 DIAGNOSIS — G629 Polyneuropathy, unspecified: Secondary | ICD-10-CM

## 2018-05-14 DIAGNOSIS — G959 Disease of spinal cord, unspecified: Secondary | ICD-10-CM

## 2018-05-21 DIAGNOSIS — R29898 Other symptoms and signs involving the musculoskeletal system: Secondary | ICD-10-CM | POA: Diagnosis not present

## 2018-05-21 DIAGNOSIS — R531 Weakness: Secondary | ICD-10-CM | POA: Diagnosis not present

## 2018-05-22 ENCOUNTER — Other Ambulatory Visit: Payer: PPO

## 2018-05-26 DIAGNOSIS — C61 Malignant neoplasm of prostate: Secondary | ICD-10-CM | POA: Diagnosis not present

## 2018-05-26 DIAGNOSIS — R972 Elevated prostate specific antigen [PSA]: Secondary | ICD-10-CM | POA: Diagnosis not present

## 2018-05-28 DIAGNOSIS — E1165 Type 2 diabetes mellitus with hyperglycemia: Secondary | ICD-10-CM | POA: Diagnosis not present

## 2018-05-28 DIAGNOSIS — R29898 Other symptoms and signs involving the musculoskeletal system: Secondary | ICD-10-CM | POA: Diagnosis not present

## 2018-05-28 DIAGNOSIS — Z7984 Long term (current) use of oral hypoglycemic drugs: Secondary | ICD-10-CM | POA: Diagnosis not present

## 2018-06-02 DIAGNOSIS — C61 Malignant neoplasm of prostate: Secondary | ICD-10-CM | POA: Diagnosis not present

## 2018-06-03 DIAGNOSIS — E785 Hyperlipidemia, unspecified: Secondary | ICD-10-CM | POA: Diagnosis not present

## 2018-06-03 DIAGNOSIS — E1165 Type 2 diabetes mellitus with hyperglycemia: Secondary | ICD-10-CM | POA: Diagnosis not present

## 2018-06-03 DIAGNOSIS — E1169 Type 2 diabetes mellitus with other specified complication: Secondary | ICD-10-CM | POA: Diagnosis not present

## 2018-06-05 DIAGNOSIS — M62561 Muscle wasting and atrophy, not elsewhere classified, right lower leg: Secondary | ICD-10-CM | POA: Diagnosis not present

## 2018-06-05 DIAGNOSIS — R29898 Other symptoms and signs involving the musculoskeletal system: Secondary | ICD-10-CM | POA: Diagnosis not present

## 2018-06-05 DIAGNOSIS — M25551 Pain in right hip: Secondary | ICD-10-CM | POA: Diagnosis not present

## 2018-06-08 ENCOUNTER — Encounter: Payer: Self-pay | Admitting: Radiation Oncology

## 2018-06-08 DIAGNOSIS — H353131 Nonexudative age-related macular degeneration, bilateral, early dry stage: Secondary | ICD-10-CM | POA: Diagnosis not present

## 2018-06-08 DIAGNOSIS — E119 Type 2 diabetes mellitus without complications: Secondary | ICD-10-CM | POA: Diagnosis not present

## 2018-06-08 DIAGNOSIS — H2513 Age-related nuclear cataract, bilateral: Secondary | ICD-10-CM | POA: Diagnosis not present

## 2018-06-08 DIAGNOSIS — R29898 Other symptoms and signs involving the musculoskeletal system: Secondary | ICD-10-CM | POA: Diagnosis not present

## 2018-06-08 DIAGNOSIS — N400 Enlarged prostate without lower urinary tract symptoms: Secondary | ICD-10-CM | POA: Diagnosis not present

## 2018-06-08 DIAGNOSIS — H4911 Fourth [trochlear] nerve palsy, right eye: Secondary | ICD-10-CM | POA: Diagnosis not present

## 2018-06-08 NOTE — Progress Notes (Signed)
GU Location of Tumor / Histology: prostatic adenocarcinoma   If Prostate Cancer, Gleason Score is (4 + 3) and PSA is (7.29). Prostate volume:48 cc.   Albert Andrews was noted to have an elevated PSA of 4.93 in 10/2008.   Biopsies of prostate (if applicable) revealed:    Past/Anticipated interventions by urology, if any: prostate biopsy, referral for consideration of brachytherapy  Past/Anticipated interventions by medical oncology, if any: no  Weight changes, if any: down from 220 lb in 2016 to 190 lb in 2020. Diagnosed recently with diabetes.   Bowel/Bladder complaints, if any: SHIM 6. IPSS 7. Reports using urinal due to muscle atrophy. Denies dysuria or hematuria. Denies urinary leakage or incontinence.    Nausea/Vomiting, if any: yes related to Metformin but none since starting Januvia.  Pain issues, if any:  denies  SAFETY ISSUES:  Prior radiation? no  Pacemaker/ICD? no  Possible current pregnancy? no,  Male patient  Is the patient on methotrexate? no  Current Complaints / other details:  68 year old male. NKDA. Divorced. Accompanied by fiance and home assistant today.Retired. Never smoked. Enjoyed golfing five days per week until February 18th. Fiance reports he has fallen 9 times since December 2019.   Scheduled to return for further evaluation at [facility] on 08/12/2018.   Several cysts within the left kidney: all simple. Separate cyst in the upper pole of the right kidney consistent with a Bosniak class II cyst.

## 2018-06-09 ENCOUNTER — Other Ambulatory Visit: Payer: Self-pay

## 2018-06-09 ENCOUNTER — Encounter: Payer: Self-pay | Admitting: Radiation Oncology

## 2018-06-09 ENCOUNTER — Encounter: Payer: Self-pay | Admitting: Medical Oncology

## 2018-06-09 ENCOUNTER — Ambulatory Visit
Admission: RE | Admit: 2018-06-09 | Discharge: 2018-06-09 | Disposition: A | Discharge status: Home / Self Care | Payer: PPO | Source: Ambulatory Visit | Attending: Radiation Oncology | Admitting: Radiation Oncology

## 2018-06-09 VITALS — BP 112/78 | HR 97 | Temp 98.5°F | Resp 20 | Ht 72.0 in | Wt 190.0 lb

## 2018-06-09 DIAGNOSIS — Z809 Family history of malignant neoplasm, unspecified: Secondary | ICD-10-CM | POA: Diagnosis not present

## 2018-06-09 DIAGNOSIS — Z993 Dependence on wheelchair: Secondary | ICD-10-CM | POA: Diagnosis not present

## 2018-06-09 DIAGNOSIS — I10 Essential (primary) hypertension: Secondary | ICD-10-CM | POA: Diagnosis not present

## 2018-06-09 DIAGNOSIS — Z7984 Long term (current) use of oral hypoglycemic drugs: Secondary | ICD-10-CM | POA: Diagnosis not present

## 2018-06-09 DIAGNOSIS — Z79899 Other long term (current) drug therapy: Secondary | ICD-10-CM | POA: Diagnosis not present

## 2018-06-09 DIAGNOSIS — C61 Malignant neoplasm of prostate: Secondary | ICD-10-CM

## 2018-06-09 DIAGNOSIS — R972 Elevated prostate specific antigen [PSA]: Secondary | ICD-10-CM | POA: Diagnosis not present

## 2018-06-09 DIAGNOSIS — R262 Difficulty in walking, not elsewhere classified: Secondary | ICD-10-CM | POA: Diagnosis not present

## 2018-06-09 HISTORY — DX: Malignant neoplasm of prostate: C61

## 2018-06-09 NOTE — Progress Notes (Signed)
See progress note under physician encounter. 

## 2018-06-09 NOTE — Progress Notes (Signed)
Radiation Oncology         [phone] ________________________________  Initial outpatient Consultation  Name: Albert Andrews MRN: [id number]  Date: 06/09/2018  DOB: 1950/08/15  DS:KAJGOT, Denton Ar, MD  Kathie Rhodes, MD   REFERRING PHYSICIAN: Kathie Rhodes, MD  DIAGNOSIS: 68 y.o. gentleman with Stage T1c adenocarcinoma of the prostate with Gleason score of 4+3, and PSA of 7.29.    ICD-10-CM   1. Malignant neoplasm of prostate [facility]) C61 Ambulatory referral to Social Work    HISTORY OF PRESENT ILLNESS: Albert Andrews is a 68 y.o. male with a diagnosis of prostate cancer. He is an established patient of [facility] followed by Dr. Diona Fanti for simple renal cysts and with Dr. Karsten Ro for elevated PSA. He was initially seen by Dr. Karsten Ro in 2015 for a mildly elevated PSA, which has been stable since that time. He has not had prior prostate biopsy.  Recent digital rectal examination performed on 04/13/2018 at the time of a follow up visit with Dr. Karsten Ro was normal. A PSA on 04/24/2018 was elevated at 7.29. The patient proceeded to transrectal ultrasound with 12 biopsies of the prostate on 05/26/2018.  The prostate volume measured 48.41 cc.  Out of 12 core biopsies, 5 were positive.  The maximum Gleason score was 4+3, and this was seen in left base lateral with ductal features. Additionally, Gleason 3+3 disease was seen in left apex lateral, left apex, right apex, and right base lateral.  Of note, the patient has had acute onset, progressive weakness with numbness/tingling in the bilateral LEs ongoing since 03/2018 and now to the point that he is unable to walk and has to rely on the use of a wheelchair since Feb. 2020. He is currently undergoing neurologic evaluation with Dr. Vallarie Mare at [facility] for suspected ALS versus polyneuropathy based on recent EMG nerve conduction study.  He has a planned follow up visit at [facility] in 08/2018 but in the interim, he has scheduled a consult with Dr. Manuella Ghazi at  [facility] for second opinion on 07/29/18.    The patient reviewed the biopsy results with his urologist and he has kindly been referred today for discussion of potential radiation treatment options.   PREVIOUS RADIATION THERAPY: No  PAST MEDICAL HISTORY:  Past Medical History:  Diagnosis Date  . Hyperlipidemia   . Hypertension   . Prostate cancer ([place])       PAST SURGICAL HISTORY: Past Surgical History:  Procedure Laterality Date  . ADENOIDECTOMY, TONSILLECTOMY AND MYRINGOTOMY WITH TUBE PLACEMENT    . PROSTATE BIOPSY    . ROTATOR CUFF REPAIR      FAMILY HISTORY:  Family History  Problem Relation Age of Onset  . Cancer Brother        unknown  . Prostate cancer Neg Hx     SOCIAL HISTORY:  Social History   Socioeconomic History  . Marital status: Divorced    Spouse name: Not on file  . Number of children: Not on file  . Years of education: Not on file  . Highest education level: Not on file  Occupational History    Comment: retired  Scientific laboratory technician  . Financial resource strain: Not on file  . Food insecurity:    Worry: Not on file    Inability: Not on file  . Transportation needs:    Medical: Not on file    Non-medical: Not on file  Tobacco Use  . Smoking status: Never Smoker  . Smokeless tobacco: Never Used  Substance  and Sexual Activity  . Alcohol use: Yes    Comment: occas  . Drug use: No  . Sexual activity: Not Currently  Lifestyle  . Physical activity:    Days per week: Not on file    Minutes per session: Not on file  . Stress: Not on file  Relationships  . Social connections:    Talks on phone: Not on file    Gets together: Not on file    Attends religious service: Not on file    Active member of club or organization: Not on file    Attends meetings of clubs or organizations: Not on file    Relationship status: Not on file  . Intimate partner violence:    Fear of current or ex partner: Not on file    Emotionally abused: Not on file    Physically  abused: Not on file    Forced sexual activity: Not on file  Other Topics Concern  . Not on file  Social History Narrative  . Not on file    ALLERGIES: Patient has no known allergies.  MEDICATIONS:  Current Outpatient Medications  Medication Sig Dispense Refill  . acetaminophen (TYLENOL) 325 MG tablet Take 650 mg by mouth every 6 (six) hours as needed.    . Blood Glucose Monitoring Suppl (FREESTYLE FREEDOM LITE) w/Device KIT USE TO CHECK GLUCOSE ONCE DAILY    . FREESTYLE LITE test strip USE STRIP TO CHECK GLUCOSE ONCE DAILY    . Lancets (FREESTYLE) lancets USE TO CHECK GLUCOSE ONCE DAILY    . sitaGLIPtin (JANUVIA) 25 MG tablet Take 25 mg by mouth daily.     No current facility-administered medications for this encounter.     REVIEW OF SYSTEMS:  On review of systems, the patient reports that he is doing well overall. He denies any chest pain, shortness of breath, cough, fevers, chills, night sweats. He denies any bowel disturbances, and denies abdominal pain, nausea or vomiting. He notes nausea and vomiting related to Metformin, but these have subsided since starting Januvia. He denies any new musculoskeletal or joint aches or pains. His IPSS was 7, indicating moderate urinary symptoms. His SHIM was 6, indicating he has severe erectile dysfunction. He reports recent neuropathy complications since 4/49/2010: right leg weakness and tingling. A complete review of systems is obtained and is otherwise negative.    PHYSICAL EXAM:  Wt Readings from Last 3 Encounters:  06/09/18 190 lb (86.2 kg)  07/31/13 229 lb (103.9 kg)  05/07/12 221 lb (100.2 kg)   Temp Readings from Last 3 Encounters:  06/09/18 98.5 F (36.9 C) (Oral)  07/31/13 98.2 F (36.8 C) (Oral)  05/07/12 98.6 F (37 C) (Oral)   BP Readings from Last 3 Encounters:  06/09/18 112/78  07/31/13 136/88  05/07/12 108/73   Pulse Readings from Last 3 Encounters:  06/09/18 97  07/31/13 68  05/07/12 79   Pain Assessment Pain  Score: 0-No pain/10  In general this is a well appearing Caucasian gentleman in no acute distress. He is alert and oriented x4 and appropriate throughout the examination. HEENT reveals that the patient is normocephalic, atraumatic. EOMs are intact. PERRLA. Skin is intact without any evidence of gross lesions. Cardiovascular exam reveals a regular rate and rhythm, no clicks rubs or murmurs are auscultated. Chest is clear to auscultation bilaterally. Lymphatic assessment is performed and does not reveal any adenopathy in the cervical, supraclavicular, axillary, or inguinal chains. Abdomen has active bowel sounds in all quadrants and is intact.  The abdomen is soft, non tender, non distended. Lower extremities are negative for pretibial pitting edema, deep calf tenderness, cyanosis or clubbing.   KPS = 70  100 - Normal; no complaints; no evidence of disease. 90   - Able to carry on normal activity; minor signs or symptoms of disease. 80   - Normal activity with effort; some signs or symptoms of disease. 22   - Cares for self; unable to carry on normal activity or to do active work. 60   - Requires occasional assistance, but is able to care for most of his personal needs. 50   - Requires considerable assistance and frequent medical care. 80   - Disabled; requires special care and assistance. 46   - Severely disabled; hospital admission is indicated although death not imminent. 36   - Very sick; hospital admission necessary; active supportive treatment necessary. 10   - Moribund; fatal processes progressing rapidly. 0     - Dead  Karnofsky DA, Abelmann [place], Craver LS and [facility]al [facility] [phone]) The use of the nitrogen mustards in the palliative treatment of carcinoma: with particular reference to bronchogenic carcinoma Cancer 1 634-56  LABORATORY DATA:  Lab Results  Component Value Date   WBC 5.9 07/31/2013   HGB 12.8 (A) 07/31/2013   HCT 39.2 (A) 07/31/2013   MCV 94.8 07/31/2013   PLT 175  03/17/2012   Lab Results  Component Value Date   NA 139 07/31/2013   K 4.0 07/31/2013   CL 103 07/31/2013   CO2 26 07/31/2013   Lab Results  Component Value Date   ALT 79 (H) 07/31/2013   AST 58 (H) 07/31/2013   ALKPHOS 81 07/31/2013   BILITOT 0.9 07/31/2013     RADIOGRAPHY: No results found.    IMPRESSION/PLAN: 1. 68 y.o. gentleman with Stage T1c adenocarcinoma of the prostate with Gleason Score of 4+3, and PSA of 7.29. We discussed the patient's workup and outlined the nature of prostate cancer in this setting. The patient's T stage, Gleason's score, and PSA put him into the unfavorable intermediate risk group. Accordingly, he is eligible for a variety of potential treatment options including brachytherapy, 5.5 weeks of external radiation or prostatectomy. We discussed the available radiation techniques, and focused on the details and logistics and delivery. We discussed and outlined the risks, benefits, short and long-term effects associated with radiotherapy and compared and contrasted these with prostatectomy. We discussed the role of SpaceOAR in reducing the rectal toxicity associated with radiotherapy.  At the end of the conversation the patient is interested in moving forward with brachytherapy and use of SpaceOAR to reduce rectal toxicity from radiotherapy.  We will share our discussion with Dr. Karsten Ro and move forward with scheduling his CT [facility] planning appointment in the near future.  The patient met briefly with Romie Jumper in our office who will be working closely with him to coordinate OR scheduling and pre and post procedure appointments.  We will contact the pharmaceutical rep to ensure that [place] is available at the time of procedure.  He will have a prostate MRI following his post-seed CT SIM to confirm appropriate distribution of the [place].  He is scheduled for neurology second opinion at [facility] in early May 2020 but would like to move forward with treatment  planning for brachytherapy to address his prostate cancer. We anticipate proceeding with brachytherapy in mid-late May unless otherwise indicated at his follow up neuro visit.  We spent 60 minutes face to face with the  patient and more than 50% of that time was spent in counseling and/or coordination of care.    Nicholos Johns, PA-C    Tyler Pita, MD  [facility] Direct Dial: [phone]  Fax: [phone] [email or web address]  Skype  LinkedIn   This document serves as a record of services personally performed by Tyler Pita, MD and Freeman Caldron, PA-C. It was created on their behalf by Wilburn Mylar, a trained medical scribe. The creation of this record is based on the scribe's personal observations and the provider's statements to them. This document has been checked and approved by the attending provider.

## 2018-06-09 NOTE — Progress Notes (Signed)
Introduced myself to Mr. Meddings and his fiance as the prostate nurse navigator and my role. He is most interested in brachytherapy and is eager to get this procedure done. He currently is having muscle weakness and being evaluated Gaspar Cola at the [place] clinic and not sure if this will impact treatment. He will discuss further with Dr. Tammi Klippel. I gave them my business card and asked them to call me with questions or concerns. He voiced understanding.

## 2018-06-10 ENCOUNTER — Encounter: Payer: Self-pay | Admitting: General Practice

## 2018-06-10 DIAGNOSIS — C61 Malignant neoplasm of prostate: Secondary | ICD-10-CM | POA: Insufficient documentation

## 2018-06-10 NOTE — Progress Notes (Signed)
[place] Psychosocial Distress Screening Clinical Social Work  Clinical Social Work was referred by distress screening protocol.  The patient scored a 9 on the Psychosocial Distress Thermometer which indicates severe distress. Clinical Social Worker contacted patient by phone to assess for distress and other psychosocial needs. CSW and patient discussed common feeling and emotions when being diagnosed with cancer, and the importance of support during treatment. CSW informed patient of the support team and support services at [facility]. CSW provided contact information and encouraged patient to call with any questions or concerns.  ONCBCN DISTRESS SCREENING 06/09/2018  Screening Type Initial Screening  Distress experienced in past week (1-10) 9  Practical problem type Transportation  Emotional problem type Depression;Nervousness/Anxiety;Adjusting to illness;Isolation/feeling alone;Feeling hopeless;Boredom;Adjusting to appearance changes  Physical Problem type Getting around;Bathing/dressing;Loss of appetitie;Tingling hands/feet;Skin dry/itchy  Physician notified of physical symptoms Yes  Referral to clinical psychology No  Referral to clinical social work Yes  Referral to dietition No  Referral to financial advocate No  Referral to support programs No  Referral to palliative care No   Clinical Social Worker follow up needed: No.  If yes, follow up plan:  Albert Andrews, [place], LCSW Clinical Social Worker Phone:  [phone]

## 2018-06-11 ENCOUNTER — Telehealth: Payer: Self-pay | Admitting: *Deleted

## 2018-06-11 ENCOUNTER — Encounter: Payer: Self-pay | Admitting: Urology

## 2018-06-11 ENCOUNTER — Encounter: Payer: Self-pay | Admitting: Medical Oncology

## 2018-06-11 DIAGNOSIS — R8279 Other abnormal findings on microbiological examination of urine: Secondary | ICD-10-CM | POA: Diagnosis not present

## 2018-06-11 DIAGNOSIS — M79604 Pain in right leg: Secondary | ICD-10-CM | POA: Diagnosis not present

## 2018-06-11 DIAGNOSIS — R262 Difficulty in walking, not elsewhere classified: Secondary | ICD-10-CM | POA: Diagnosis not present

## 2018-06-11 DIAGNOSIS — C61 Malignant neoplasm of prostate: Secondary | ICD-10-CM | POA: Diagnosis not present

## 2018-06-11 NOTE — Progress Notes (Signed)
Per message from [place]-  we need to hold off on scheduling brachytherapy for Mr. Simson due to ongoing investigation of lower extremity weakness, possible ALS.  Dr. Karsten Ro is planning to contact the patient and start him on Lupron ADT and then see him back in 6 months.  Dr. Karsten Ro will let us know at that time if he is ready to proceed with brachytherapy.     Nicholos Johns, MMS, PA-C [place] at [place]: [phone]  Fax: [phone]

## 2018-06-11 NOTE — Telephone Encounter (Signed)
CALLED PATIENT TO CHECK ON LUPRON INJ. - PATIENT STATED THAT HE HAD LUPRON INJ. TODAY (06/11/18)

## 2018-06-14 NOTE — Addendum Note (Signed)
Encounter addended by: Tyler Pita, MD on: 06/14/2018 1:59 PM  Actions taken: Clinical Note Signed

## 2018-06-14 NOTE — Progress Notes (Signed)
Due to the patient's work-up for ALS, we will postpone proceeding with brachytherapy.  Dr. Karsten Ro will administer hormone therapy now to control the prostate cancer pending neurologic work-up.

## 2018-06-15 ENCOUNTER — Telehealth: Payer: Self-pay | Admitting: *Deleted

## 2018-06-15 ENCOUNTER — Other Ambulatory Visit: Payer: Self-pay | Admitting: *Deleted

## 2018-06-15 NOTE — Patient Outreach (Addendum)
[facility]) Care Management  06/15/2018  Albert Andrews 27-Dec-1950 [id number]   Subjective: Telephone call to patient's home  / mobile number, no answer, left HIPAA compliant voicemail message, and requested call back.     Objective: Per KPN (Knowledge Performance Now, point of care tool) and chart review, patient has not had any recent hospitalization or ED visits.   Patient also has a history of diabetes, hypertension, hyperlipidemia, and prostate cancer.      Assessment: Received [employer] MD Referral on 06/02/2018.   Referral source: Robyn at Dr. Glenna Durand office.   Referral reason: needs nurse, social worker, wheelchair bound, needs transportation, and assist with activities of daily.   Screening  follow up pending patient contact.       Plan: RNCM will send unsuccessful outreach  letter, [facility] pamphlet, will call patient for 2nd telephone outreach attempt within 4 business days, screening follow up, and will proceed with case closure within 10 business days if no return call.      Leighton Brickley H. Annia Friendly, BSN, [place] Management [facility] Telephonic CM Phone: [phone] Fax: [phone]

## 2018-06-15 NOTE — Telephone Encounter (Signed)
CALLED PATIENT TO INFORM OF PRE-SEED PLANNING CT FOR 07-22-18, LVM FOR A RETURN CALL

## 2018-06-16 ENCOUNTER — Other Ambulatory Visit: Payer: Self-pay | Admitting: *Deleted

## 2018-06-16 NOTE — Patient Outreach (Signed)
[facility]) Care Management  06/16/2018  Lockridge 07-11-1950 [id number]   Referral from : [employer] Referral source: Dr. Lysle Rubens office  Referral reason : Needs nurse, social worker, wheel chair bound, needs transportation and assist with activity of daily living.   Successful outreach call to patient home, explained reason for the call. Patient requested that I speak with his Fiance' [place]  .   Patient and caregiver further discussed :  Conditions  Patient fiance discussed that patient has a fairly new diagnosis of Diabetes, she discussed that his A1c is 8.8 and he only takes oral medications Januvia . She reports recent blood sugar checks at home was 98 on yesterday.  She discussed that patient is followed at [facility] by neurology Dr. Vallarie Mare for lower leg weakness  for possible diagnosis of ALS, they are awaiting further test results.  Patient has history of Prostate Cancer and following up at oncology center to begin treatment plan .   Social  Patient lives at home with his fiance Amy Loletha Grayer that is assisting him at present with activities of daily living. She is concern regarding increased weakness in lower legs and care needs and now he has been mostly wheelchair bound since January .  She discussed MD has ordered home health PT/OT and she has received a message from nurse at [facility] and plans to return call on today.  She discussed patient is now mostly wheelchair bound. She reports having several appropriate medical equipments in place at home he has a hospital bed and is now downstairs she has contacted PCP office regarding air mattress for his bed,  wheelchair, shower that is wheel chair accessible . Patient wife reports that she is able to provide transportation as they have a Lucianne Lei that wheelchair ramp.  High Fall risk Report of at least 2 falls in the last 3 months due to leg weakness.  Medications Patient is only on oral agent for  diabetes and caregiver assist patient with daily medication, no cost concerns.  Advanced Directives Patient has a living well and HCPOA in place  Consent   [facility] care management services have been explained and offered. Patient is agreeable to [facility] care management services of management of chronic medical conditions, new diagnosis of diabetes, high fall risk, may benefit home safety evaluation .   Patient and wife are interested in additional support resources with daily care .   Plan  Will place referral for [facility] care management for care coordination, management of chronic medical condition diabetes, high fall risk .  Will place social worker referral regarding resources for daily care needs  .  Joylene Draft, RN, [place] Management Coordinator  [phone]- Mobile [phone]- Toll Free Main Office

## 2018-06-17 ENCOUNTER — Other Ambulatory Visit: Payer: Self-pay

## 2018-06-17 DIAGNOSIS — E1065 Type 1 diabetes mellitus with hyperglycemia: Secondary | ICD-10-CM | POA: Diagnosis not present

## 2018-06-17 DIAGNOSIS — E1165 Type 2 diabetes mellitus with hyperglycemia: Secondary | ICD-10-CM | POA: Diagnosis not present

## 2018-06-17 NOTE — Patient Outreach (Signed)
[facility]) Care Management  06/17/2018  Ridgely Anastacio Scheiber 04-27-1950 [id number]   Successful outreach to patient regarding social work referral for "additional support of daily care needs."  BSW spoke with patient but he requested that I speak with his fiance, Helaine Chess.  Per fiance report, patient has an upcoming assessment for [facility] services.  He also receives aide services through [facility] which they are privately paying for.  BSW informed patient that these particular services are only covered by Medicaid.  Based on information provided by fiance, patient is over the income limit for Medicaid but she did request that BSW mail application. BSW will follow up within the next two weeks to ensure receipt of application.   Ronn Melena, BSW Social Worker [phone]

## 2018-06-18 DIAGNOSIS — M5136 Other intervertebral disc degeneration, lumbar region: Secondary | ICD-10-CM | POA: Diagnosis not present

## 2018-06-18 DIAGNOSIS — E1165 Type 2 diabetes mellitus with hyperglycemia: Secondary | ICD-10-CM | POA: Diagnosis not present

## 2018-06-18 DIAGNOSIS — R030 Elevated blood-pressure reading, without diagnosis of hypertension: Secondary | ICD-10-CM | POA: Diagnosis not present

## 2018-06-18 DIAGNOSIS — E7800 Pure hypercholesterolemia, unspecified: Secondary | ICD-10-CM | POA: Diagnosis not present

## 2018-06-18 DIAGNOSIS — J309 Allergic rhinitis, unspecified: Secondary | ICD-10-CM | POA: Diagnosis not present

## 2018-06-22 DIAGNOSIS — E1169 Type 2 diabetes mellitus with other specified complication: Secondary | ICD-10-CM | POA: Diagnosis not present

## 2018-06-22 DIAGNOSIS — E785 Hyperlipidemia, unspecified: Secondary | ICD-10-CM | POA: Diagnosis not present

## 2018-06-22 DIAGNOSIS — E1165 Type 2 diabetes mellitus with hyperglycemia: Secondary | ICD-10-CM | POA: Diagnosis not present

## 2018-06-29 DIAGNOSIS — E119 Type 2 diabetes mellitus without complications: Secondary | ICD-10-CM | POA: Diagnosis not present

## 2018-06-29 DIAGNOSIS — R531 Weakness: Secondary | ICD-10-CM | POA: Diagnosis not present

## 2018-06-29 DIAGNOSIS — R1310 Dysphagia, unspecified: Secondary | ICD-10-CM | POA: Diagnosis not present

## 2018-06-30 ENCOUNTER — Telehealth: Payer: Self-pay | Admitting: *Deleted

## 2018-06-30 ENCOUNTER — Other Ambulatory Visit: Payer: Self-pay

## 2018-06-30 ENCOUNTER — Other Ambulatory Visit: Payer: Self-pay | Admitting: Urology

## 2018-06-30 NOTE — Patient Outreach (Signed)
[facility]) Care Management  06/30/2018  Tomas Schamp Gidley 04/28/1950 [id number]   Successful outreach to patient's spouse, however, she reported that they did not receive the Medicaid application mailed on 7/44/51.  Spouse requested that application be sent via email. Application sent via secure email.  BSW closing case at this time.  Ronn Melena, BSW Social Worker [phone]

## 2018-06-30 NOTE — Telephone Encounter (Signed)
CALLED PATIENT TO INFORM OF IMPLANT DATE, SPOKE WITH PATIENT'S SGO - AMY AND SHE IS AWARE OF THIS DATE.

## 2018-07-04 DIAGNOSIS — R29898 Other symptoms and signs involving the musculoskeletal system: Secondary | ICD-10-CM | POA: Diagnosis not present

## 2018-07-04 DIAGNOSIS — M25551 Pain in right hip: Secondary | ICD-10-CM | POA: Diagnosis not present

## 2018-07-04 DIAGNOSIS — M62561 Muscle wasting and atrophy, not elsewhere classified, right lower leg: Secondary | ICD-10-CM | POA: Diagnosis not present

## 2018-07-08 DIAGNOSIS — I429 Cardiomyopathy, unspecified: Secondary | ICD-10-CM | POA: Diagnosis not present

## 2018-07-08 DIAGNOSIS — E1165 Type 2 diabetes mellitus with hyperglycemia: Secondary | ICD-10-CM | POA: Diagnosis not present

## 2018-07-08 DIAGNOSIS — Z79899 Other long term (current) drug therapy: Secondary | ICD-10-CM | POA: Diagnosis not present

## 2018-07-08 DIAGNOSIS — I2510 Atherosclerotic heart disease of native coronary artery without angina pectoris: Secondary | ICD-10-CM | POA: Diagnosis not present

## 2018-07-08 DIAGNOSIS — M19012 Primary osteoarthritis, left shoulder: Secondary | ICD-10-CM | POA: Diagnosis not present

## 2018-07-08 DIAGNOSIS — M5136 Other intervertebral disc degeneration, lumbar region: Secondary | ICD-10-CM | POA: Diagnosis not present

## 2018-07-08 DIAGNOSIS — R030 Elevated blood-pressure reading, without diagnosis of hypertension: Secondary | ICD-10-CM | POA: Diagnosis not present

## 2018-07-08 DIAGNOSIS — R0602 Shortness of breath: Secondary | ICD-10-CM | POA: Diagnosis not present

## 2018-07-08 DIAGNOSIS — I4891 Unspecified atrial fibrillation: Secondary | ICD-10-CM | POA: Diagnosis not present

## 2018-07-08 DIAGNOSIS — J309 Allergic rhinitis, unspecified: Secondary | ICD-10-CM | POA: Diagnosis not present

## 2018-07-08 DIAGNOSIS — I890 Lymphedema, not elsewhere classified: Secondary | ICD-10-CM | POA: Diagnosis not present

## 2018-07-08 DIAGNOSIS — E7800 Pure hypercholesterolemia, unspecified: Secondary | ICD-10-CM | POA: Diagnosis not present

## 2018-07-08 DIAGNOSIS — I5030 Unspecified diastolic (congestive) heart failure: Secondary | ICD-10-CM | POA: Diagnosis not present

## 2018-07-08 DIAGNOSIS — Z720 Tobacco use: Secondary | ICD-10-CM | POA: Diagnosis not present

## 2018-07-22 ENCOUNTER — Ambulatory Visit: Payer: Self-pay | Admitting: Urology

## 2018-07-22 ENCOUNTER — Ambulatory Visit: Payer: PPO | Admitting: Radiation Oncology

## 2018-07-22 DIAGNOSIS — K210 Gastro-esophageal reflux disease with esophagitis: Secondary | ICD-10-CM | POA: Diagnosis not present

## 2018-07-22 DIAGNOSIS — R1310 Dysphagia, unspecified: Secondary | ICD-10-CM | POA: Diagnosis not present

## 2018-07-28 DIAGNOSIS — E7800 Pure hypercholesterolemia, unspecified: Secondary | ICD-10-CM | POA: Diagnosis not present

## 2018-07-28 DIAGNOSIS — E1165 Type 2 diabetes mellitus with hyperglycemia: Secondary | ICD-10-CM | POA: Diagnosis not present

## 2018-07-28 DIAGNOSIS — J309 Allergic rhinitis, unspecified: Secondary | ICD-10-CM | POA: Diagnosis not present

## 2018-07-28 DIAGNOSIS — M5136 Other intervertebral disc degeneration, lumbar region: Secondary | ICD-10-CM | POA: Diagnosis not present

## 2018-07-28 DIAGNOSIS — R030 Elevated blood-pressure reading, without diagnosis of hypertension: Secondary | ICD-10-CM | POA: Diagnosis not present

## 2018-07-30 DIAGNOSIS — E1165 Type 2 diabetes mellitus with hyperglycemia: Secondary | ICD-10-CM | POA: Diagnosis not present

## 2018-07-30 DIAGNOSIS — E1169 Type 2 diabetes mellitus with other specified complication: Secondary | ICD-10-CM | POA: Diagnosis not present

## 2018-07-30 DIAGNOSIS — E785 Hyperlipidemia, unspecified: Secondary | ICD-10-CM | POA: Diagnosis not present

## 2018-08-04 DIAGNOSIS — M62561 Muscle wasting and atrophy, not elsewhere classified, right lower leg: Secondary | ICD-10-CM | POA: Diagnosis not present

## 2018-08-04 DIAGNOSIS — E1165 Type 2 diabetes mellitus with hyperglycemia: Secondary | ICD-10-CM | POA: Diagnosis not present

## 2018-08-04 DIAGNOSIS — R29898 Other symptoms and signs involving the musculoskeletal system: Secondary | ICD-10-CM | POA: Diagnosis not present

## 2018-08-04 DIAGNOSIS — M25551 Pain in right hip: Secondary | ICD-10-CM | POA: Diagnosis not present

## 2018-08-11 DIAGNOSIS — E1165 Type 2 diabetes mellitus with hyperglycemia: Secondary | ICD-10-CM | POA: Diagnosis not present

## 2018-08-11 DIAGNOSIS — R29898 Other symptoms and signs involving the musculoskeletal system: Secondary | ICD-10-CM | POA: Diagnosis not present

## 2018-08-11 DIAGNOSIS — J309 Allergic rhinitis, unspecified: Secondary | ICD-10-CM | POA: Diagnosis not present

## 2018-08-11 DIAGNOSIS — R030 Elevated blood-pressure reading, without diagnosis of hypertension: Secondary | ICD-10-CM | POA: Diagnosis not present

## 2018-08-11 DIAGNOSIS — M5136 Other intervertebral disc degeneration, lumbar region: Secondary | ICD-10-CM | POA: Diagnosis not present

## 2018-08-11 DIAGNOSIS — Z7984 Long term (current) use of oral hypoglycemic drugs: Secondary | ICD-10-CM | POA: Diagnosis not present

## 2018-08-11 DIAGNOSIS — C61 Malignant neoplasm of prostate: Secondary | ICD-10-CM | POA: Diagnosis not present

## 2018-08-11 DIAGNOSIS — E7800 Pure hypercholesterolemia, unspecified: Secondary | ICD-10-CM | POA: Diagnosis not present

## 2018-08-12 DIAGNOSIS — M62561 Muscle wasting and atrophy, not elsewhere classified, right lower leg: Secondary | ICD-10-CM | POA: Diagnosis not present

## 2018-08-12 DIAGNOSIS — R29898 Other symptoms and signs involving the musculoskeletal system: Secondary | ICD-10-CM | POA: Diagnosis not present

## 2018-08-13 ENCOUNTER — Other Ambulatory Visit: Payer: Self-pay | Admitting: Urology

## 2018-08-13 DIAGNOSIS — E1140 Type 2 diabetes mellitus with diabetic neuropathy, unspecified: Secondary | ICD-10-CM | POA: Diagnosis not present

## 2018-08-13 DIAGNOSIS — K219 Gastro-esophageal reflux disease without esophagitis: Secondary | ICD-10-CM | POA: Diagnosis not present

## 2018-08-13 DIAGNOSIS — M6281 Muscle weakness (generalized): Secondary | ICD-10-CM | POA: Diagnosis not present

## 2018-08-13 DIAGNOSIS — R972 Elevated prostate specific antigen [PSA]: Secondary | ICD-10-CM | POA: Diagnosis not present

## 2018-08-13 DIAGNOSIS — R1310 Dysphagia, unspecified: Secondary | ICD-10-CM | POA: Diagnosis not present

## 2018-08-20 DIAGNOSIS — R42 Dizziness and giddiness: Secondary | ICD-10-CM | POA: Diagnosis not present

## 2018-08-26 DIAGNOSIS — R531 Weakness: Secondary | ICD-10-CM | POA: Diagnosis not present

## 2018-08-26 DIAGNOSIS — Z7409 Other reduced mobility: Secondary | ICD-10-CM | POA: Diagnosis not present

## 2018-08-26 DIAGNOSIS — G1221 Amyotrophic lateral sclerosis: Secondary | ICD-10-CM | POA: Diagnosis not present

## 2018-08-27 ENCOUNTER — Other Ambulatory Visit: Payer: Self-pay | Admitting: Internal Medicine

## 2018-08-27 DIAGNOSIS — R42 Dizziness and giddiness: Secondary | ICD-10-CM | POA: Diagnosis not present

## 2018-08-27 DIAGNOSIS — R030 Elevated blood-pressure reading, without diagnosis of hypertension: Secondary | ICD-10-CM | POA: Diagnosis not present

## 2018-08-28 ENCOUNTER — Ambulatory Visit
Admission: RE | Admit: 2018-08-28 | Discharge: 2018-08-28 | Disposition: A | Discharge status: Home / Self Care | Payer: PPO | Source: Ambulatory Visit | Attending: Internal Medicine | Admitting: Internal Medicine

## 2018-08-28 ENCOUNTER — Other Ambulatory Visit: Payer: Self-pay

## 2018-08-28 DIAGNOSIS — R42 Dizziness and giddiness: Secondary | ICD-10-CM

## 2018-09-02 DIAGNOSIS — E1165 Type 2 diabetes mellitus with hyperglycemia: Secondary | ICD-10-CM | POA: Diagnosis not present

## 2018-09-02 DIAGNOSIS — E785 Hyperlipidemia, unspecified: Secondary | ICD-10-CM | POA: Diagnosis not present

## 2018-09-02 DIAGNOSIS — E1169 Type 2 diabetes mellitus with other specified complication: Secondary | ICD-10-CM | POA: Diagnosis not present

## 2018-09-02 DIAGNOSIS — E1140 Type 2 diabetes mellitus with diabetic neuropathy, unspecified: Secondary | ICD-10-CM | POA: Diagnosis not present

## 2018-09-03 DIAGNOSIS — R29898 Other symptoms and signs involving the musculoskeletal system: Secondary | ICD-10-CM | POA: Diagnosis not present

## 2018-09-03 DIAGNOSIS — M62561 Muscle wasting and atrophy, not elsewhere classified, right lower leg: Secondary | ICD-10-CM | POA: Diagnosis not present

## 2018-09-03 DIAGNOSIS — M25551 Pain in right hip: Secondary | ICD-10-CM | POA: Diagnosis not present

## 2018-09-08 DIAGNOSIS — M6281 Muscle weakness (generalized): Secondary | ICD-10-CM | POA: Diagnosis not present

## 2018-09-08 DIAGNOSIS — R5383 Other fatigue: Secondary | ICD-10-CM | POA: Diagnosis not present

## 2018-09-09 DIAGNOSIS — R5383 Other fatigue: Secondary | ICD-10-CM | POA: Diagnosis not present

## 2018-09-10 DIAGNOSIS — E1142 Type 2 diabetes mellitus with diabetic polyneuropathy: Secondary | ICD-10-CM | POA: Diagnosis not present

## 2018-09-10 DIAGNOSIS — R531 Weakness: Secondary | ICD-10-CM | POA: Diagnosis not present

## 2018-09-17 DIAGNOSIS — R1310 Dysphagia, unspecified: Secondary | ICD-10-CM | POA: Diagnosis not present

## 2018-09-17 DIAGNOSIS — E7800 Pure hypercholesterolemia, unspecified: Secondary | ICD-10-CM | POA: Diagnosis not present

## 2018-09-17 DIAGNOSIS — Z9181 History of falling: Secondary | ICD-10-CM | POA: Diagnosis not present

## 2018-09-17 DIAGNOSIS — J309 Allergic rhinitis, unspecified: Secondary | ICD-10-CM | POA: Diagnosis not present

## 2018-09-17 DIAGNOSIS — E119 Type 2 diabetes mellitus without complications: Secondary | ICD-10-CM | POA: Diagnosis not present

## 2018-09-17 DIAGNOSIS — N281 Cyst of kidney, acquired: Secondary | ICD-10-CM | POA: Diagnosis not present

## 2018-09-17 DIAGNOSIS — Z8673 Personal history of transient ischemic attack (TIA), and cerebral infarction without residual deficits: Secondary | ICD-10-CM | POA: Diagnosis not present

## 2018-09-17 DIAGNOSIS — I10 Essential (primary) hypertension: Secondary | ICD-10-CM | POA: Diagnosis not present

## 2018-09-17 DIAGNOSIS — R972 Elevated prostate specific antigen [PSA]: Secondary | ICD-10-CM | POA: Diagnosis not present

## 2018-09-17 DIAGNOSIS — M47816 Spondylosis without myelopathy or radiculopathy, lumbar region: Secondary | ICD-10-CM | POA: Diagnosis not present

## 2018-09-18 DIAGNOSIS — R29898 Other symptoms and signs involving the musculoskeletal system: Secondary | ICD-10-CM | POA: Diagnosis not present

## 2018-09-18 DIAGNOSIS — R200 Anesthesia of skin: Secondary | ICD-10-CM | POA: Diagnosis not present

## 2018-09-18 DIAGNOSIS — Z8546 Personal history of malignant neoplasm of prostate: Secondary | ICD-10-CM | POA: Diagnosis not present

## 2018-09-18 DIAGNOSIS — R531 Weakness: Secondary | ICD-10-CM | POA: Diagnosis not present

## 2018-09-18 DIAGNOSIS — R262 Difficulty in walking, not elsewhere classified: Secondary | ICD-10-CM | POA: Diagnosis not present

## 2018-09-18 DIAGNOSIS — M6258 Muscle wasting and atrophy, not elsewhere classified, other site: Secondary | ICD-10-CM | POA: Diagnosis not present

## 2018-09-22 ENCOUNTER — Other Ambulatory Visit: Payer: Self-pay | Admitting: Neurology

## 2018-09-22 DIAGNOSIS — R29898 Other symptoms and signs involving the musculoskeletal system: Secondary | ICD-10-CM

## 2018-09-24 DIAGNOSIS — R1310 Dysphagia, unspecified: Secondary | ICD-10-CM | POA: Diagnosis not present

## 2018-09-24 DIAGNOSIS — J309 Allergic rhinitis, unspecified: Secondary | ICD-10-CM | POA: Diagnosis not present

## 2018-09-24 DIAGNOSIS — M47816 Spondylosis without myelopathy or radiculopathy, lumbar region: Secondary | ICD-10-CM | POA: Diagnosis not present

## 2018-09-24 DIAGNOSIS — I10 Essential (primary) hypertension: Secondary | ICD-10-CM | POA: Diagnosis not present

## 2018-09-24 DIAGNOSIS — R972 Elevated prostate specific antigen [PSA]: Secondary | ICD-10-CM | POA: Diagnosis not present

## 2018-09-24 DIAGNOSIS — Z9181 History of falling: Secondary | ICD-10-CM | POA: Diagnosis not present

## 2018-09-24 DIAGNOSIS — Z8673 Personal history of transient ischemic attack (TIA), and cerebral infarction without residual deficits: Secondary | ICD-10-CM | POA: Diagnosis not present

## 2018-09-24 DIAGNOSIS — E7800 Pure hypercholesterolemia, unspecified: Secondary | ICD-10-CM | POA: Diagnosis not present

## 2018-09-24 DIAGNOSIS — E119 Type 2 diabetes mellitus without complications: Secondary | ICD-10-CM | POA: Diagnosis not present

## 2018-09-24 DIAGNOSIS — N281 Cyst of kidney, acquired: Secondary | ICD-10-CM | POA: Diagnosis not present

## 2018-09-28 DIAGNOSIS — R1310 Dysphagia, unspecified: Secondary | ICD-10-CM | POA: Diagnosis not present

## 2018-09-28 DIAGNOSIS — R630 Anorexia: Secondary | ICD-10-CM | POA: Diagnosis not present

## 2018-09-28 DIAGNOSIS — E46 Unspecified protein-calorie malnutrition: Secondary | ICD-10-CM | POA: Diagnosis not present

## 2018-10-01 DIAGNOSIS — E1165 Type 2 diabetes mellitus with hyperglycemia: Secondary | ICD-10-CM | POA: Diagnosis not present

## 2018-10-01 DIAGNOSIS — E785 Hyperlipidemia, unspecified: Secondary | ICD-10-CM | POA: Diagnosis not present

## 2018-10-01 DIAGNOSIS — E1169 Type 2 diabetes mellitus with other specified complication: Secondary | ICD-10-CM | POA: Diagnosis not present

## 2018-10-01 DIAGNOSIS — E1140 Type 2 diabetes mellitus with diabetic neuropathy, unspecified: Secondary | ICD-10-CM | POA: Diagnosis not present

## 2018-10-04 DIAGNOSIS — M62561 Muscle wasting and atrophy, not elsewhere classified, right lower leg: Secondary | ICD-10-CM | POA: Diagnosis not present

## 2018-10-04 DIAGNOSIS — R29898 Other symptoms and signs involving the musculoskeletal system: Secondary | ICD-10-CM | POA: Diagnosis not present

## 2018-10-04 DIAGNOSIS — M25551 Pain in right hip: Secondary | ICD-10-CM | POA: Diagnosis not present

## 2018-10-07 DIAGNOSIS — R972 Elevated prostate specific antigen [PSA]: Secondary | ICD-10-CM | POA: Diagnosis not present

## 2018-10-07 DIAGNOSIS — Z9181 History of falling: Secondary | ICD-10-CM | POA: Diagnosis not present

## 2018-10-07 DIAGNOSIS — Z8673 Personal history of transient ischemic attack (TIA), and cerebral infarction without residual deficits: Secondary | ICD-10-CM | POA: Diagnosis not present

## 2018-10-07 DIAGNOSIS — I10 Essential (primary) hypertension: Secondary | ICD-10-CM | POA: Diagnosis not present

## 2018-10-07 DIAGNOSIS — J309 Allergic rhinitis, unspecified: Secondary | ICD-10-CM | POA: Diagnosis not present

## 2018-10-07 DIAGNOSIS — R1310 Dysphagia, unspecified: Secondary | ICD-10-CM | POA: Diagnosis not present

## 2018-10-07 DIAGNOSIS — E119 Type 2 diabetes mellitus without complications: Secondary | ICD-10-CM | POA: Diagnosis not present

## 2018-10-07 DIAGNOSIS — E7800 Pure hypercholesterolemia, unspecified: Secondary | ICD-10-CM | POA: Diagnosis not present

## 2018-10-07 DIAGNOSIS — N281 Cyst of kidney, acquired: Secondary | ICD-10-CM | POA: Diagnosis not present

## 2018-10-07 DIAGNOSIS — M47816 Spondylosis without myelopathy or radiculopathy, lumbar region: Secondary | ICD-10-CM | POA: Diagnosis not present

## 2018-10-08 ENCOUNTER — Other Ambulatory Visit (HOSPITAL_COMMUNITY): Payer: Self-pay

## 2018-10-08 DIAGNOSIS — R1310 Dysphagia, unspecified: Secondary | ICD-10-CM

## 2018-10-13 DIAGNOSIS — E162 Hypoglycemia, unspecified: Secondary | ICD-10-CM | POA: Diagnosis not present

## 2018-10-13 DIAGNOSIS — E781 Pure hyperglyceridemia: Secondary | ICD-10-CM | POA: Diagnosis not present

## 2018-10-13 DIAGNOSIS — E1165 Type 2 diabetes mellitus with hyperglycemia: Secondary | ICD-10-CM | POA: Diagnosis not present

## 2018-10-13 DIAGNOSIS — R634 Abnormal weight loss: Secondary | ICD-10-CM | POA: Diagnosis not present

## 2018-10-14 ENCOUNTER — Other Ambulatory Visit: Payer: Self-pay

## 2018-10-14 ENCOUNTER — Ambulatory Visit
Admission: RE | Admit: 2018-10-14 | Discharge: 2018-10-14 | Disposition: A | Discharge status: Home / Self Care | Payer: PPO | Source: Ambulatory Visit | Attending: Neurology | Admitting: Neurology

## 2018-10-14 ENCOUNTER — Ambulatory Visit (HOSPITAL_COMMUNITY)
Admission: RE | Admit: 2018-10-14 | Discharge: 2018-10-14 | Disposition: A | Discharge status: Home / Self Care | Payer: PPO | Source: Ambulatory Visit | Attending: Internal Medicine | Admitting: Internal Medicine

## 2018-10-14 DIAGNOSIS — R1310 Dysphagia, unspecified: Secondary | ICD-10-CM

## 2018-10-14 DIAGNOSIS — C61 Malignant neoplasm of prostate: Secondary | ICD-10-CM | POA: Diagnosis not present

## 2018-10-14 DIAGNOSIS — G1221 Amyotrophic lateral sclerosis: Secondary | ICD-10-CM | POA: Diagnosis not present

## 2018-10-14 DIAGNOSIS — R531 Weakness: Secondary | ICD-10-CM | POA: Diagnosis not present

## 2018-10-14 DIAGNOSIS — R29898 Other symptoms and signs involving the musculoskeletal system: Secondary | ICD-10-CM

## 2018-10-16 DIAGNOSIS — E162 Hypoglycemia, unspecified: Secondary | ICD-10-CM | POA: Diagnosis not present

## 2018-10-26 DIAGNOSIS — E1169 Type 2 diabetes mellitus with other specified complication: Secondary | ICD-10-CM | POA: Diagnosis not present

## 2018-10-26 DIAGNOSIS — R1310 Dysphagia, unspecified: Secondary | ICD-10-CM | POA: Diagnosis not present

## 2018-11-02 DIAGNOSIS — E7800 Pure hypercholesterolemia, unspecified: Secondary | ICD-10-CM | POA: Diagnosis not present

## 2018-11-02 DIAGNOSIS — Z9181 History of falling: Secondary | ICD-10-CM | POA: Diagnosis not present

## 2018-11-02 DIAGNOSIS — J309 Allergic rhinitis, unspecified: Secondary | ICD-10-CM | POA: Diagnosis not present

## 2018-11-02 DIAGNOSIS — N281 Cyst of kidney, acquired: Secondary | ICD-10-CM | POA: Diagnosis not present

## 2018-11-02 DIAGNOSIS — M47816 Spondylosis without myelopathy or radiculopathy, lumbar region: Secondary | ICD-10-CM | POA: Diagnosis not present

## 2018-11-02 DIAGNOSIS — I10 Essential (primary) hypertension: Secondary | ICD-10-CM | POA: Diagnosis not present

## 2018-11-02 DIAGNOSIS — Z8673 Personal history of transient ischemic attack (TIA), and cerebral infarction without residual deficits: Secondary | ICD-10-CM | POA: Diagnosis not present

## 2018-11-02 DIAGNOSIS — E119 Type 2 diabetes mellitus without complications: Secondary | ICD-10-CM | POA: Diagnosis not present

## 2018-11-02 DIAGNOSIS — R972 Elevated prostate specific antigen [PSA]: Secondary | ICD-10-CM | POA: Diagnosis not present

## 2018-11-02 DIAGNOSIS — R1310 Dysphagia, unspecified: Secondary | ICD-10-CM | POA: Diagnosis not present

## 2018-11-11 DIAGNOSIS — M6258 Muscle wasting and atrophy, not elsewhere classified, other site: Secondary | ICD-10-CM | POA: Diagnosis not present

## 2018-11-11 DIAGNOSIS — R262 Difficulty in walking, not elsewhere classified: Secondary | ICD-10-CM | POA: Diagnosis not present

## 2018-11-11 DIAGNOSIS — E1142 Type 2 diabetes mellitus with diabetic polyneuropathy: Secondary | ICD-10-CM | POA: Diagnosis not present

## 2018-11-11 DIAGNOSIS — R29898 Other symptoms and signs involving the musculoskeletal system: Secondary | ICD-10-CM | POA: Diagnosis not present

## 2018-11-11 DIAGNOSIS — G1229 Other motor neuron disease: Secondary | ICD-10-CM | POA: Diagnosis not present

## 2018-11-25 DIAGNOSIS — Z79899 Other long term (current) drug therapy: Secondary | ICD-10-CM | POA: Diagnosis not present

## 2018-11-25 DIAGNOSIS — G1221 Amyotrophic lateral sclerosis: Secondary | ICD-10-CM | POA: Diagnosis not present

## 2018-11-25 DIAGNOSIS — E1142 Type 2 diabetes mellitus with diabetic polyneuropathy: Secondary | ICD-10-CM | POA: Diagnosis not present

## 2018-11-26 DIAGNOSIS — E1169 Type 2 diabetes mellitus with other specified complication: Secondary | ICD-10-CM | POA: Diagnosis not present

## 2018-12-07 DIAGNOSIS — M792 Neuralgia and neuritis, unspecified: Secondary | ICD-10-CM | POA: Diagnosis not present

## 2018-12-08 DIAGNOSIS — C61 Malignant neoplasm of prostate: Secondary | ICD-10-CM | POA: Diagnosis not present

## 2018-12-11 DIAGNOSIS — Z7409 Other reduced mobility: Secondary | ICD-10-CM | POA: Diagnosis not present

## 2018-12-11 DIAGNOSIS — R531 Weakness: Secondary | ICD-10-CM | POA: Diagnosis not present

## 2018-12-11 DIAGNOSIS — E46 Unspecified protein-calorie malnutrition: Secondary | ICD-10-CM | POA: Diagnosis not present

## 2018-12-11 DIAGNOSIS — E1140 Type 2 diabetes mellitus with diabetic neuropathy, unspecified: Secondary | ICD-10-CM | POA: Diagnosis not present

## 2018-12-11 DIAGNOSIS — G1221 Amyotrophic lateral sclerosis: Secondary | ICD-10-CM | POA: Diagnosis not present

## 2018-12-15 DIAGNOSIS — C61 Malignant neoplasm of prostate: Secondary | ICD-10-CM | POA: Diagnosis not present

## 2018-12-16 DIAGNOSIS — M79672 Pain in left foot: Secondary | ICD-10-CM | POA: Diagnosis not present

## 2018-12-16 DIAGNOSIS — M79671 Pain in right foot: Secondary | ICD-10-CM | POA: Diagnosis not present

## 2018-12-24 ENCOUNTER — Encounter: Payer: Self-pay | Admitting: Urology

## 2018-12-24 NOTE — Progress Notes (Signed)
Per documentation from recent f/u visit with Dr. Karsten Ro on 12/15/18 if has been confirmed that the patient does have ALS so he is going to just remain on Lupron ADT, intermittently and forego any curative therapies for his prostate cancer.  Nicholos Johns, MMS, PA-C [place] at [place]: [phone]  Fax: [phone]

## 2018-12-25 ENCOUNTER — Encounter (HOSPITAL_BASED_OUTPATIENT_CLINIC_OR_DEPARTMENT_OTHER): Payer: Self-pay

## 2018-12-25 ENCOUNTER — Ambulatory Visit (HOSPITAL_BASED_OUTPATIENT_CLINIC_OR_DEPARTMENT_OTHER): Admit: 2018-12-25 | Payer: PPO | Admitting: Urology

## 2018-12-25 SURGERY — INSERTION, RADIATION SOURCE, PROSTATE
Anesthesia: General

## 2018-12-28 DIAGNOSIS — G1221 Amyotrophic lateral sclerosis: Secondary | ICD-10-CM | POA: Diagnosis not present

## 2019-01-15 DIAGNOSIS — M79672 Pain in left foot: Secondary | ICD-10-CM | POA: Diagnosis not present

## 2019-01-15 DIAGNOSIS — M79671 Pain in right foot: Secondary | ICD-10-CM | POA: Diagnosis not present

## 2019-01-28 ENCOUNTER — Other Ambulatory Visit: Payer: Self-pay | Admitting: *Deleted

## 2019-02-02 DIAGNOSIS — E1140 Type 2 diabetes mellitus with diabetic neuropathy, unspecified: Secondary | ICD-10-CM | POA: Diagnosis not present

## 2019-02-02 DIAGNOSIS — E1169 Type 2 diabetes mellitus with other specified complication: Secondary | ICD-10-CM | POA: Diagnosis not present

## 2019-02-02 DIAGNOSIS — E785 Hyperlipidemia, unspecified: Secondary | ICD-10-CM | POA: Diagnosis not present

## 2019-02-02 DIAGNOSIS — E1165 Type 2 diabetes mellitus with hyperglycemia: Secondary | ICD-10-CM | POA: Diagnosis not present

## 2019-02-03 DIAGNOSIS — M13841 Other specified arthritis, right hand: Secondary | ICD-10-CM | POA: Diagnosis not present

## 2019-02-03 DIAGNOSIS — M79644 Pain in right finger(s): Secondary | ICD-10-CM | POA: Diagnosis not present

## 2019-02-03 DIAGNOSIS — M13842 Other specified arthritis, left hand: Secondary | ICD-10-CM | POA: Diagnosis not present

## 2019-02-03 DIAGNOSIS — G1221 Amyotrophic lateral sclerosis: Secondary | ICD-10-CM | POA: Diagnosis not present

## 2019-02-03 DIAGNOSIS — M79645 Pain in left finger(s): Secondary | ICD-10-CM | POA: Diagnosis not present

## 2019-02-15 DIAGNOSIS — M79671 Pain in right foot: Secondary | ICD-10-CM | POA: Diagnosis not present

## 2019-02-15 DIAGNOSIS — M79672 Pain in left foot: Secondary | ICD-10-CM | POA: Diagnosis not present

## 2019-02-17 DIAGNOSIS — Z9181 History of falling: Secondary | ICD-10-CM | POA: Diagnosis not present

## 2019-02-17 DIAGNOSIS — Z7409 Other reduced mobility: Secondary | ICD-10-CM | POA: Diagnosis not present

## 2019-02-17 DIAGNOSIS — R531 Weakness: Secondary | ICD-10-CM | POA: Diagnosis not present

## 2019-02-17 DIAGNOSIS — G1221 Amyotrophic lateral sclerosis: Secondary | ICD-10-CM | POA: Diagnosis not present

## 2019-02-17 DIAGNOSIS — E1140 Type 2 diabetes mellitus with diabetic neuropathy, unspecified: Secondary | ICD-10-CM | POA: Diagnosis not present

## 2019-02-17 DIAGNOSIS — Z79899 Other long term (current) drug therapy: Secondary | ICD-10-CM | POA: Diagnosis not present

## 2019-03-17 DIAGNOSIS — M79671 Pain in right foot: Secondary | ICD-10-CM | POA: Diagnosis not present

## 2019-03-17 DIAGNOSIS — M79672 Pain in left foot: Secondary | ICD-10-CM | POA: Diagnosis not present

## 2019-03-29 DIAGNOSIS — E1165 Type 2 diabetes mellitus with hyperglycemia: Secondary | ICD-10-CM | POA: Diagnosis not present

## 2019-03-29 DIAGNOSIS — E162 Hypoglycemia, unspecified: Secondary | ICD-10-CM | POA: Diagnosis not present

## 2019-03-29 DIAGNOSIS — E781 Pure hyperglyceridemia: Secondary | ICD-10-CM | POA: Diagnosis not present

## 2019-04-06 DIAGNOSIS — C61 Malignant neoplasm of prostate: Secondary | ICD-10-CM | POA: Diagnosis not present

## 2019-04-08 DIAGNOSIS — E1165 Type 2 diabetes mellitus with hyperglycemia: Secondary | ICD-10-CM | POA: Diagnosis not present

## 2019-04-08 DIAGNOSIS — E1169 Type 2 diabetes mellitus with other specified complication: Secondary | ICD-10-CM | POA: Diagnosis not present

## 2019-04-08 DIAGNOSIS — E785 Hyperlipidemia, unspecified: Secondary | ICD-10-CM | POA: Diagnosis not present

## 2019-04-08 DIAGNOSIS — E1140 Type 2 diabetes mellitus with diabetic neuropathy, unspecified: Secondary | ICD-10-CM | POA: Diagnosis not present

## 2019-04-17 DIAGNOSIS — M79672 Pain in left foot: Secondary | ICD-10-CM | POA: Diagnosis not present

## 2019-04-17 DIAGNOSIS — M79671 Pain in right foot: Secondary | ICD-10-CM | POA: Diagnosis not present

## 2019-05-18 DIAGNOSIS — M79672 Pain in left foot: Secondary | ICD-10-CM | POA: Diagnosis not present

## 2019-05-18 DIAGNOSIS — M79671 Pain in right foot: Secondary | ICD-10-CM | POA: Diagnosis not present

## 2019-05-19 DIAGNOSIS — E1140 Type 2 diabetes mellitus with diabetic neuropathy, unspecified: Secondary | ICD-10-CM | POA: Diagnosis not present

## 2019-05-19 DIAGNOSIS — R29898 Other symptoms and signs involving the musculoskeletal system: Secondary | ICD-10-CM | POA: Diagnosis not present

## 2019-05-19 DIAGNOSIS — R449 Unspecified symptoms and signs involving general sensations and perceptions: Secondary | ICD-10-CM | POA: Diagnosis not present

## 2019-05-19 DIAGNOSIS — G1221 Amyotrophic lateral sclerosis: Secondary | ICD-10-CM | POA: Diagnosis not present

## 2019-05-19 DIAGNOSIS — Z9181 History of falling: Secondary | ICD-10-CM | POA: Diagnosis not present

## 2019-05-21 ENCOUNTER — Other Ambulatory Visit: Payer: Self-pay | Admitting: Sports Medicine

## 2019-05-21 DIAGNOSIS — M25511 Pain in right shoulder: Secondary | ICD-10-CM

## 2019-05-21 DIAGNOSIS — M25512 Pain in left shoulder: Secondary | ICD-10-CM

## 2019-05-24 DIAGNOSIS — M6258 Muscle wasting and atrophy, not elsewhere classified, other site: Secondary | ICD-10-CM | POA: Diagnosis not present

## 2019-05-24 DIAGNOSIS — R29898 Other symptoms and signs involving the musculoskeletal system: Secondary | ICD-10-CM | POA: Diagnosis not present

## 2019-05-29 ENCOUNTER — Ambulatory Visit: Payer: PPO | Attending: Internal Medicine

## 2019-05-29 DIAGNOSIS — Z23 Encounter for immunization: Secondary | ICD-10-CM | POA: Insufficient documentation

## 2019-05-29 NOTE — Progress Notes (Signed)
   Covid-19 Vaccination Clinic  Name:  Albert Andrews    MRN: [id number] DOB: [birth year]  05/29/2019  [name] was observed post Covid-19 immunization for 15 minutes without incidence. He was provided with Vaccine Information Sheet and instruction to access the V-Safe system.   [name] was instructed to call 911 with any severe reactions post vaccine: [name] Difficulty breathing  . Swelling of your face and throat  . A fast heartbeat  . A bad rash all over your body  . Dizziness and weakness    Immunizations Administered    Name Date Dose VIS Date Route   Pfizer COVID-19 Vaccine 05/29/2019 11:26 AM 0.3 mL 03/19/2019 Intramuscular   Manufacturer: [place]   Lot: [id number]   [place]: [id number]

## 2019-05-30 ENCOUNTER — Ambulatory Visit: Payer: PPO

## 2019-06-02 DIAGNOSIS — E1165 Type 2 diabetes mellitus with hyperglycemia: Secondary | ICD-10-CM | POA: Diagnosis not present

## 2019-06-08 DIAGNOSIS — R634 Abnormal weight loss: Secondary | ICD-10-CM | POA: Diagnosis not present

## 2019-06-08 DIAGNOSIS — C61 Malignant neoplasm of prostate: Secondary | ICD-10-CM | POA: Diagnosis not present

## 2019-06-08 DIAGNOSIS — E781 Pure hyperglyceridemia: Secondary | ICD-10-CM | POA: Diagnosis not present

## 2019-06-08 DIAGNOSIS — E1165 Type 2 diabetes mellitus with hyperglycemia: Secondary | ICD-10-CM | POA: Diagnosis not present

## 2019-06-08 DIAGNOSIS — E162 Hypoglycemia, unspecified: Secondary | ICD-10-CM | POA: Diagnosis not present

## 2019-06-10 DIAGNOSIS — M6281 Muscle weakness (generalized): Secondary | ICD-10-CM | POA: Diagnosis not present

## 2019-06-10 DIAGNOSIS — E46 Unspecified protein-calorie malnutrition: Secondary | ICD-10-CM | POA: Diagnosis not present

## 2019-06-10 DIAGNOSIS — E1140 Type 2 diabetes mellitus with diabetic neuropathy, unspecified: Secondary | ICD-10-CM | POA: Diagnosis not present

## 2019-06-10 DIAGNOSIS — E1169 Type 2 diabetes mellitus with other specified complication: Secondary | ICD-10-CM | POA: Diagnosis not present

## 2019-06-10 DIAGNOSIS — K219 Gastro-esophageal reflux disease without esophagitis: Secondary | ICD-10-CM | POA: Diagnosis not present

## 2019-06-15 ENCOUNTER — Other Ambulatory Visit: Payer: PPO

## 2019-06-15 DIAGNOSIS — R630 Anorexia: Secondary | ICD-10-CM | POA: Diagnosis not present

## 2019-06-15 DIAGNOSIS — M79671 Pain in right foot: Secondary | ICD-10-CM | POA: Diagnosis not present

## 2019-06-15 DIAGNOSIS — N400 Enlarged prostate without lower urinary tract symptoms: Secondary | ICD-10-CM | POA: Diagnosis not present

## 2019-06-15 DIAGNOSIS — M79672 Pain in left foot: Secondary | ICD-10-CM | POA: Diagnosis not present

## 2019-06-15 DIAGNOSIS — C61 Malignant neoplasm of prostate: Secondary | ICD-10-CM | POA: Diagnosis not present

## 2019-06-16 ENCOUNTER — Ambulatory Visit
Admission: RE | Admit: 2019-06-16 | Discharge: 2019-06-16 | Disposition: A | Discharge status: Home / Self Care | Payer: PPO | Source: Ambulatory Visit | Attending: Sports Medicine | Admitting: Sports Medicine

## 2019-06-16 ENCOUNTER — Other Ambulatory Visit: Payer: Self-pay

## 2019-06-16 DIAGNOSIS — M25511 Pain in right shoulder: Secondary | ICD-10-CM | POA: Diagnosis not present

## 2019-06-16 DIAGNOSIS — M25512 Pain in left shoulder: Secondary | ICD-10-CM

## 2019-06-16 DIAGNOSIS — M75122 Complete rotator cuff tear or rupture of left shoulder, not specified as traumatic: Secondary | ICD-10-CM | POA: Diagnosis not present

## 2019-06-22 ENCOUNTER — Ambulatory Visit: Payer: PPO | Attending: Internal Medicine

## 2019-06-22 DIAGNOSIS — H2513 Age-related nuclear cataract, bilateral: Secondary | ICD-10-CM | POA: Diagnosis not present

## 2019-06-22 DIAGNOSIS — H353131 Nonexudative age-related macular degeneration, bilateral, early dry stage: Secondary | ICD-10-CM | POA: Diagnosis not present

## 2019-06-22 DIAGNOSIS — E119 Type 2 diabetes mellitus without complications: Secondary | ICD-10-CM | POA: Diagnosis not present

## 2019-06-22 DIAGNOSIS — H4911 Fourth [trochlear] nerve palsy, right eye: Secondary | ICD-10-CM | POA: Diagnosis not present

## 2019-06-22 DIAGNOSIS — Z23 Encounter for immunization: Secondary | ICD-10-CM

## 2019-06-22 NOTE — Progress Notes (Signed)
   Covid-19 Vaccination Clinic  Name:  Albert Andrews    MRN: [id number] DOB: June 18, 1950  06/22/2019  Mr. [name] was observed post Covid-19 immunization for 15 minutes without incident. He was provided with Vaccine Information Sheet and instruction to access the V-Safe system.   Mr. [name] was instructed to call 911 with any severe reactions post vaccine: [name] Difficulty breathing  . Swelling of face and throat  . A fast heartbeat  . A bad rash all over body  . Dizziness and weakness   Immunizations Administered    Name Date Dose VIS Date Route   Pfizer COVID-19 Vaccine 06/22/2019 11:09 AM 0.3 mL 03/19/2019 Intramuscular   Manufacturer: [place]   Lot: [id number]   [place]: [id number]

## 2019-06-24 DIAGNOSIS — M25511 Pain in right shoulder: Secondary | ICD-10-CM | POA: Diagnosis not present

## 2019-06-24 DIAGNOSIS — M75120 Complete rotator cuff tear or rupture of unspecified shoulder, not specified as traumatic: Secondary | ICD-10-CM | POA: Diagnosis not present

## 2019-06-24 DIAGNOSIS — M25512 Pain in left shoulder: Secondary | ICD-10-CM | POA: Diagnosis not present

## 2019-06-28 DIAGNOSIS — M9901 Segmental and somatic dysfunction of cervical region: Secondary | ICD-10-CM | POA: Diagnosis not present

## 2019-06-28 DIAGNOSIS — M530 Cervicocranial syndrome: Secondary | ICD-10-CM | POA: Diagnosis not present

## 2019-06-30 DIAGNOSIS — M530 Cervicocranial syndrome: Secondary | ICD-10-CM | POA: Diagnosis not present

## 2019-06-30 DIAGNOSIS — M9901 Segmental and somatic dysfunction of cervical region: Secondary | ICD-10-CM | POA: Diagnosis not present

## 2019-07-06 DIAGNOSIS — M530 Cervicocranial syndrome: Secondary | ICD-10-CM | POA: Diagnosis not present

## 2019-07-06 DIAGNOSIS — M9901 Segmental and somatic dysfunction of cervical region: Secondary | ICD-10-CM | POA: Diagnosis not present

## 2019-07-07 DIAGNOSIS — M9901 Segmental and somatic dysfunction of cervical region: Secondary | ICD-10-CM | POA: Diagnosis not present

## 2019-07-07 DIAGNOSIS — M530 Cervicocranial syndrome: Secondary | ICD-10-CM | POA: Diagnosis not present

## 2019-07-12 DIAGNOSIS — M9901 Segmental and somatic dysfunction of cervical region: Secondary | ICD-10-CM | POA: Diagnosis not present

## 2019-07-12 DIAGNOSIS — M530 Cervicocranial syndrome: Secondary | ICD-10-CM | POA: Diagnosis not present

## 2019-07-14 DIAGNOSIS — M530 Cervicocranial syndrome: Secondary | ICD-10-CM | POA: Diagnosis not present

## 2019-07-14 DIAGNOSIS — M9901 Segmental and somatic dysfunction of cervical region: Secondary | ICD-10-CM | POA: Diagnosis not present

## 2019-07-16 DIAGNOSIS — M79671 Pain in right foot: Secondary | ICD-10-CM | POA: Diagnosis not present

## 2019-07-16 DIAGNOSIS — M79672 Pain in left foot: Secondary | ICD-10-CM | POA: Diagnosis not present

## 2019-07-17 DIAGNOSIS — G1221 Amyotrophic lateral sclerosis: Secondary | ICD-10-CM | POA: Diagnosis not present

## 2019-07-17 DIAGNOSIS — M25412 Effusion, left shoulder: Secondary | ICD-10-CM | POA: Diagnosis not present

## 2019-07-17 DIAGNOSIS — I10 Essential (primary) hypertension: Secondary | ICD-10-CM | POA: Diagnosis not present

## 2019-07-17 DIAGNOSIS — M75121 Complete rotator cuff tear or rupture of right shoulder, not specified as traumatic: Secondary | ICD-10-CM | POA: Diagnosis not present

## 2019-07-17 DIAGNOSIS — M75122 Complete rotator cuff tear or rupture of left shoulder, not specified as traumatic: Secondary | ICD-10-CM | POA: Diagnosis not present

## 2019-07-17 DIAGNOSIS — E785 Hyperlipidemia, unspecified: Secondary | ICD-10-CM | POA: Diagnosis not present

## 2019-07-17 DIAGNOSIS — M25411 Effusion, right shoulder: Secondary | ICD-10-CM | POA: Diagnosis not present

## 2019-07-17 DIAGNOSIS — K219 Gastro-esophageal reflux disease without esophagitis: Secondary | ICD-10-CM | POA: Diagnosis not present

## 2019-07-24 DIAGNOSIS — M25412 Effusion, left shoulder: Secondary | ICD-10-CM | POA: Diagnosis not present

## 2019-07-24 DIAGNOSIS — K219 Gastro-esophageal reflux disease without esophagitis: Secondary | ICD-10-CM | POA: Diagnosis not present

## 2019-07-24 DIAGNOSIS — G1221 Amyotrophic lateral sclerosis: Secondary | ICD-10-CM | POA: Diagnosis not present

## 2019-07-24 DIAGNOSIS — I10 Essential (primary) hypertension: Secondary | ICD-10-CM | POA: Diagnosis not present

## 2019-07-24 DIAGNOSIS — M75122 Complete rotator cuff tear or rupture of left shoulder, not specified as traumatic: Secondary | ICD-10-CM | POA: Diagnosis not present

## 2019-07-24 DIAGNOSIS — M75121 Complete rotator cuff tear or rupture of right shoulder, not specified as traumatic: Secondary | ICD-10-CM | POA: Diagnosis not present

## 2019-07-24 DIAGNOSIS — M25411 Effusion, right shoulder: Secondary | ICD-10-CM | POA: Diagnosis not present

## 2019-07-24 DIAGNOSIS — E785 Hyperlipidemia, unspecified: Secondary | ICD-10-CM | POA: Diagnosis not present

## 2019-07-26 DIAGNOSIS — M9901 Segmental and somatic dysfunction of cervical region: Secondary | ICD-10-CM | POA: Diagnosis not present

## 2019-07-26 DIAGNOSIS — M530 Cervicocranial syndrome: Secondary | ICD-10-CM | POA: Diagnosis not present

## 2019-07-28 DIAGNOSIS — M530 Cervicocranial syndrome: Secondary | ICD-10-CM | POA: Diagnosis not present

## 2019-07-28 DIAGNOSIS — M9901 Segmental and somatic dysfunction of cervical region: Secondary | ICD-10-CM | POA: Diagnosis not present

## 2019-08-02 DIAGNOSIS — M9901 Segmental and somatic dysfunction of cervical region: Secondary | ICD-10-CM | POA: Diagnosis not present

## 2019-08-02 DIAGNOSIS — M530 Cervicocranial syndrome: Secondary | ICD-10-CM | POA: Diagnosis not present

## 2019-08-04 DIAGNOSIS — M530 Cervicocranial syndrome: Secondary | ICD-10-CM | POA: Diagnosis not present

## 2019-08-04 DIAGNOSIS — M9901 Segmental and somatic dysfunction of cervical region: Secondary | ICD-10-CM | POA: Diagnosis not present

## 2019-08-10 DIAGNOSIS — M530 Cervicocranial syndrome: Secondary | ICD-10-CM | POA: Diagnosis not present

## 2019-08-10 DIAGNOSIS — M9901 Segmental and somatic dysfunction of cervical region: Secondary | ICD-10-CM | POA: Diagnosis not present

## 2019-08-12 DIAGNOSIS — G1221 Amyotrophic lateral sclerosis: Secondary | ICD-10-CM | POA: Diagnosis not present

## 2019-08-12 DIAGNOSIS — K219 Gastro-esophageal reflux disease without esophagitis: Secondary | ICD-10-CM | POA: Diagnosis not present

## 2019-08-12 DIAGNOSIS — M75121 Complete rotator cuff tear or rupture of right shoulder, not specified as traumatic: Secondary | ICD-10-CM | POA: Diagnosis not present

## 2019-08-12 DIAGNOSIS — E1165 Type 2 diabetes mellitus with hyperglycemia: Secondary | ICD-10-CM | POA: Diagnosis not present

## 2019-08-12 DIAGNOSIS — M75122 Complete rotator cuff tear or rupture of left shoulder, not specified as traumatic: Secondary | ICD-10-CM | POA: Diagnosis not present

## 2019-08-12 DIAGNOSIS — I10 Essential (primary) hypertension: Secondary | ICD-10-CM | POA: Diagnosis not present

## 2019-08-12 DIAGNOSIS — M25411 Effusion, right shoulder: Secondary | ICD-10-CM | POA: Diagnosis not present

## 2019-08-12 DIAGNOSIS — M25412 Effusion, left shoulder: Secondary | ICD-10-CM | POA: Diagnosis not present

## 2019-08-12 DIAGNOSIS — E1169 Type 2 diabetes mellitus with other specified complication: Secondary | ICD-10-CM | POA: Diagnosis not present

## 2019-08-12 DIAGNOSIS — E785 Hyperlipidemia, unspecified: Secondary | ICD-10-CM | POA: Diagnosis not present

## 2019-08-12 DIAGNOSIS — E1140 Type 2 diabetes mellitus with diabetic neuropathy, unspecified: Secondary | ICD-10-CM | POA: Diagnosis not present

## 2019-08-15 DIAGNOSIS — M79671 Pain in right foot: Secondary | ICD-10-CM | POA: Diagnosis not present

## 2019-08-15 DIAGNOSIS — M79672 Pain in left foot: Secondary | ICD-10-CM | POA: Diagnosis not present

## 2019-08-16 DIAGNOSIS — M9901 Segmental and somatic dysfunction of cervical region: Secondary | ICD-10-CM | POA: Diagnosis not present

## 2019-08-16 DIAGNOSIS — M530 Cervicocranial syndrome: Secondary | ICD-10-CM | POA: Diagnosis not present

## 2019-08-18 DIAGNOSIS — M530 Cervicocranial syndrome: Secondary | ICD-10-CM | POA: Diagnosis not present

## 2019-08-18 DIAGNOSIS — M9901 Segmental and somatic dysfunction of cervical region: Secondary | ICD-10-CM | POA: Diagnosis not present

## 2019-08-19 DIAGNOSIS — Z7409 Other reduced mobility: Secondary | ICD-10-CM | POA: Diagnosis not present

## 2019-08-19 DIAGNOSIS — R531 Weakness: Secondary | ICD-10-CM | POA: Diagnosis not present

## 2019-08-19 DIAGNOSIS — G1221 Amyotrophic lateral sclerosis: Secondary | ICD-10-CM | POA: Diagnosis not present

## 2019-08-23 DIAGNOSIS — M9901 Segmental and somatic dysfunction of cervical region: Secondary | ICD-10-CM | POA: Diagnosis not present

## 2019-08-23 DIAGNOSIS — M530 Cervicocranial syndrome: Secondary | ICD-10-CM | POA: Diagnosis not present

## 2019-08-24 DIAGNOSIS — K219 Gastro-esophageal reflux disease without esophagitis: Secondary | ICD-10-CM | POA: Diagnosis not present

## 2019-08-24 DIAGNOSIS — M75122 Complete rotator cuff tear or rupture of left shoulder, not specified as traumatic: Secondary | ICD-10-CM | POA: Diagnosis not present

## 2019-08-24 DIAGNOSIS — M25411 Effusion, right shoulder: Secondary | ICD-10-CM | POA: Diagnosis not present

## 2019-08-24 DIAGNOSIS — I10 Essential (primary) hypertension: Secondary | ICD-10-CM | POA: Diagnosis not present

## 2019-08-24 DIAGNOSIS — E785 Hyperlipidemia, unspecified: Secondary | ICD-10-CM | POA: Diagnosis not present

## 2019-08-24 DIAGNOSIS — M75121 Complete rotator cuff tear or rupture of right shoulder, not specified as traumatic: Secondary | ICD-10-CM | POA: Diagnosis not present

## 2019-08-24 DIAGNOSIS — G1221 Amyotrophic lateral sclerosis: Secondary | ICD-10-CM | POA: Diagnosis not present

## 2019-08-24 DIAGNOSIS — M25412 Effusion, left shoulder: Secondary | ICD-10-CM | POA: Diagnosis not present

## 2019-08-25 DIAGNOSIS — R29898 Other symptoms and signs involving the musculoskeletal system: Secondary | ICD-10-CM | POA: Diagnosis not present

## 2019-08-25 DIAGNOSIS — G1221 Amyotrophic lateral sclerosis: Secondary | ICD-10-CM | POA: Diagnosis not present

## 2019-09-03 DIAGNOSIS — E1165 Type 2 diabetes mellitus with hyperglycemia: Secondary | ICD-10-CM | POA: Diagnosis not present

## 2019-09-08 DIAGNOSIS — E785 Hyperlipidemia, unspecified: Secondary | ICD-10-CM | POA: Diagnosis not present

## 2019-09-08 DIAGNOSIS — M75122 Complete rotator cuff tear or rupture of left shoulder, not specified as traumatic: Secondary | ICD-10-CM | POA: Diagnosis not present

## 2019-09-08 DIAGNOSIS — M25412 Effusion, left shoulder: Secondary | ICD-10-CM | POA: Diagnosis not present

## 2019-09-08 DIAGNOSIS — I10 Essential (primary) hypertension: Secondary | ICD-10-CM | POA: Diagnosis not present

## 2019-09-08 DIAGNOSIS — G1221 Amyotrophic lateral sclerosis: Secondary | ICD-10-CM | POA: Diagnosis not present

## 2019-09-08 DIAGNOSIS — M25411 Effusion, right shoulder: Secondary | ICD-10-CM | POA: Diagnosis not present

## 2019-09-08 DIAGNOSIS — M75121 Complete rotator cuff tear or rupture of right shoulder, not specified as traumatic: Secondary | ICD-10-CM | POA: Diagnosis not present

## 2019-09-08 DIAGNOSIS — K219 Gastro-esophageal reflux disease without esophagitis: Secondary | ICD-10-CM | POA: Diagnosis not present

## 2019-09-10 DIAGNOSIS — E781 Pure hyperglyceridemia: Secondary | ICD-10-CM | POA: Diagnosis not present

## 2019-09-10 DIAGNOSIS — E162 Hypoglycemia, unspecified: Secondary | ICD-10-CM | POA: Diagnosis not present

## 2019-09-10 DIAGNOSIS — C61 Malignant neoplasm of prostate: Secondary | ICD-10-CM | POA: Diagnosis not present

## 2019-09-10 DIAGNOSIS — E1165 Type 2 diabetes mellitus with hyperglycemia: Secondary | ICD-10-CM | POA: Diagnosis not present

## 2019-09-10 DIAGNOSIS — R634 Abnormal weight loss: Secondary | ICD-10-CM | POA: Diagnosis not present

## 2019-09-15 DIAGNOSIS — M79672 Pain in left foot: Secondary | ICD-10-CM | POA: Diagnosis not present

## 2019-09-15 DIAGNOSIS — M79671 Pain in right foot: Secondary | ICD-10-CM | POA: Diagnosis not present

## 2019-09-17 DIAGNOSIS — C61 Malignant neoplasm of prostate: Secondary | ICD-10-CM | POA: Diagnosis not present

## 2019-09-21 DIAGNOSIS — G1229 Other motor neuron disease: Secondary | ICD-10-CM | POA: Diagnosis not present

## 2019-09-21 DIAGNOSIS — R262 Difficulty in walking, not elsewhere classified: Secondary | ICD-10-CM | POA: Diagnosis not present

## 2019-09-22 DIAGNOSIS — M75122 Complete rotator cuff tear or rupture of left shoulder, not specified as traumatic: Secondary | ICD-10-CM | POA: Diagnosis not present

## 2019-09-22 DIAGNOSIS — M75121 Complete rotator cuff tear or rupture of right shoulder, not specified as traumatic: Secondary | ICD-10-CM | POA: Diagnosis not present

## 2019-09-22 DIAGNOSIS — M25411 Effusion, right shoulder: Secondary | ICD-10-CM | POA: Diagnosis not present

## 2019-09-22 DIAGNOSIS — M25412 Effusion, left shoulder: Secondary | ICD-10-CM | POA: Diagnosis not present

## 2019-09-22 DIAGNOSIS — G1221 Amyotrophic lateral sclerosis: Secondary | ICD-10-CM | POA: Diagnosis not present

## 2019-09-22 DIAGNOSIS — I10 Essential (primary) hypertension: Secondary | ICD-10-CM | POA: Diagnosis not present

## 2019-09-22 DIAGNOSIS — K219 Gastro-esophageal reflux disease without esophagitis: Secondary | ICD-10-CM | POA: Diagnosis not present

## 2019-09-22 DIAGNOSIS — E785 Hyperlipidemia, unspecified: Secondary | ICD-10-CM | POA: Diagnosis not present

## 2019-09-22 DIAGNOSIS — C61 Malignant neoplasm of prostate: Secondary | ICD-10-CM | POA: Diagnosis not present

## 2019-09-22 DIAGNOSIS — Z5111 Encounter for antineoplastic chemotherapy: Secondary | ICD-10-CM | POA: Diagnosis not present

## 2019-09-23 DIAGNOSIS — E1165 Type 2 diabetes mellitus with hyperglycemia: Secondary | ICD-10-CM | POA: Diagnosis not present

## 2019-10-01 ENCOUNTER — Other Ambulatory Visit: Payer: Self-pay

## 2019-10-01 ENCOUNTER — Ambulatory Visit (INDEPENDENT_AMBULATORY_CARE_PROVIDER_SITE_OTHER): Payer: PPO

## 2019-10-01 ENCOUNTER — Encounter: Payer: Self-pay | Admitting: Internal Medicine

## 2019-10-01 ENCOUNTER — Ambulatory Visit: Payer: PPO | Admitting: Internal Medicine

## 2019-10-01 DIAGNOSIS — R058 Other specified cough: Secondary | ICD-10-CM | POA: Insufficient documentation

## 2019-10-01 DIAGNOSIS — R05 Cough: Secondary | ICD-10-CM | POA: Diagnosis not present

## 2019-10-01 NOTE — Progress Notes (Signed)
Albert Andrews, male    DOB: 1951/01/08, 69 y.o.   MRN: [id number]   Brief patient profile:  1 yowf never smoker  varsity baseball player for [employer] worked at [employer] then legs gave out R then L  late in Dec 2019 ? W/u at [facility] and [place] ? ALS  But improving in Nov 2020 to point of walking daily with "up and go" walker doing driveway walking x 4 laps = several hundred feet  Stops due to arms give out before breathing but wife concerned mostly about chronic cough so self referred to pulmonary clinic 10/01/2019     History of Present Illness  10/01/2019  Pulmonary/ 1st office eval/Lativia Velie  Chief Complaint  Patient presents with  . Pulmonary Consult    Self referral. Spouse concerned about nocturnal cough- pt denies this.   Dyspnea: Pt not aware of sob but severely limited by leg weakness and deconditioning Cough: x 6 months sporadic p hs / typically sleeping almost flat  Sleep: pt says sleep fine but feels fine and not aware of cough or throat clearing.  SABA use: none  No obvious day to day or daytime variability or assoc excess/ purulent sputum or mucus plugs or hemoptysis or cp or chest tightness, subjective wheeze or overt sinus or hb symptoms.    Also denies any obvious fluctuation of symptoms with weather or environmental changes or other aggravating or alleviating factors except as outlined above   No unusual exposure hx or h/o childhood pna/ asthma or knowledge of premature birth.  Current Allergies, Complete Past Medical History, Past Surgical History, Family History, and Social History were reviewed in [employer] [employer] record.  ROS  The following are not active complaints unless bolded Hoarseness, sore throat, dysphagia, dental problems, itching, sneezing,  nasal congestion or discharge of excess mucus or purulent secretions, ear ache,   fever, chills, sweats, unintended wt loss or wt gain, classically pleuritic or exertional cp,  orthopnea pnd or  arm/hand swelling  or leg swelling, presyncope, palpitations, abdominal pain, anorexia, nausea, vomiting, diarrhea  or change in bowel habits or change in bladder habits, change in stools or change in urine, dysuria, hematuria,  rash, arthralgias, visual complaints, headache, numbness, weakness or  problems with walking or coordination,  change in mood or  memory.           Past Medical History:  Diagnosis Date  . Hyperlipidemia   . Hypertension   . Prostate cancer [facility])     Outpatient Medications Prior to Visit  Medication Sig Dispense Refill  . Andrews Glucose Monitoring Suppl (FREESTYLE FREEDOM LITE) w/Device KIT USE TO CHECK GLUCOSE ONCE DAILY    . FREESTYLE LITE test strip USE STRIP TO CHECK GLUCOSE ONCE DAILY    . gabapentin (NEURONTIN) 300 MG capsule Take 600 mg by mouth 2 (two) times daily.    . Insulin Degludec (TRESIBA) 100 UNIT/ML SOLN Inject 10 mLs into the skin daily.    . Lancets (FREESTYLE) lancets USE TO CHECK GLUCOSE ONCE DAILY    . meclizine (ANTIVERT) 25 MG tablet Take 25 mg by mouth 3 (three) times daily as needed for dizziness.    [name] MIRTAZAPINE PO Take 15 mg by mouth daily.    . riluzole (RILUTEK) 50 MG tablet Take 50 mg by mouth every 12 (twelve) hours.    . simvastatin (ZOCOR) 10 MG tablet Take 10 mg by mouth daily.    [name] acetaminophen (TYLENOL) 325 MG tablet Take  650 mg by mouth every 6 (six) hours as needed.    . sitaGLIPtin (JANUVIA) 25 MG tablet Take 25 mg by mouth daily.     No facility-administered medications prior to visit.     Objective:     BP 132/80 (BP Location: Left Arm, Cuff Size: Normal)   Pulse 72   Temp 98.3 F (36.8 C) (Temporal)   SpO2 97% Comment: on RA  SpO2: 97 % (on RA)   W/c bound / hoarse wm with occ throat clearing and classic pseudowheeze esp on FIVC/FVC   HEENT : pt wearing mask not removed for exam due to covid -19 concerns.    NECK :  without JVD/Nodes/TM/ nl carotid upstrokes bilaterally   LUNGS: no acc muscle use,  Nl  contour chest which is clear to A and P bilaterally without cough on insp or exp maneuvers   CV:  RRR  no s3 or murmur or increase in P2, and no edema   ABD:  soft and nontender with nl inspiratory excursion in the supine position. No bruits or organomegaly appreciated, bowel sounds nl  MS:    ext warm without deformities, calf tenderness, cyanosis or clubbing No obvious joint restrictions   SKIN: warm and dry without lesions    NEURO:  alert, approp, nl sensorium with  no motor or cerebellar deficits apparent.    CXR PA and Lateral:   10/01/2019 :    I personally reviewed images  impression as follows:  wnl         Assessment   Upper airway cough syndrome Onset around jan 2021 in setting of unexplained leg weakness  - ST eval 10/14/2018 difficulty initiating swallow of tablet but no evidence of aspiration > rec no dietary restrictions  - Spirometry 05/19/19   FEV1 2.16  (61%)  Ratio 0.76  With min curature on exp f/v     Of the three most common causes of  Sub-acute / recurrent or chronic cough, only one (GERD)  can actually contribute to/ trigger  the other two (asthma and post nasal drip syndrome)  and perpetuate the cylce of cough.  While not intuitively obvious, many patients with chronic low grade reflux do not cough until there is a primary insult that disturbs the protective epithelial barrier and exposes sensitive nerve endings.   This is typically viral but can due to PNDS and  either may apply here.    >>>  The point is that once this occurs, it is difficult to eliminate the cycle   But sometimes something as simple as approp diet/ lifestyle changes and  making pt aware he is doing it and encouraging the use of hard rock candies not containing mint/ menthol products and eliminating pnds with 1st gen H1 blockers per guidelines  Will eliminate it and if not then add max acid suppression next and refer to ENT for f/u.   He does not appear to have a pulmonary problem at this  point so f/u can be prn         Each maintenance medication was reviewed in detail including emphasizing most importantly the difference between maintenance and prns and under what circumstances the prns are to be triggered using an action plan format where appropriate.  Total time for H and P, chart review, counseling,  and generating customized AVS unique to this office visit / charting = 30 min           Christinia Gully, MD 10/01/2019

## 2019-10-01 NOTE — Patient Instructions (Addendum)
GERD (REFLUX)  is an extremely common cause of respiratory symptoms just like yours , many times with no obvious heartburn at all.    It can be treated with medication, but also with lifestyle changes including elevation of the head of your bed (ideally with 6-8inch blocks under the headboard of your bed),  Smoking cessation, avoidance of late meals, excessive alcohol, and avoid fatty foods, chocolate, peppermint, colas, red wine, and acidic juices such as orange juice.  NO MINT OR MENTHOL PRODUCTS SO NO COUGH DROPS  USE SUGARLESS CANDY INSTEAD (Jolley ranchers or Stover's or Life Savers) or even ice chips will also do - the key is to swallow to prevent all throat clearing. NO OIL BASED VITAMINS - use powdered substitutes.  Avoid fish oil when coughing.  For drainage / throat tickle try take CHLORPHENIRAMINE  4 mg  (Chlortab 4mg   at [employer] should be easiest to find in the green box)  take one every 4 hours as needed - available over the counter- may cause drowsiness so start with just a bedtime dose or two and see how you tolerate it before trying in daytime    Please remember to go to the  x-ray department  for your tests - we will call you with the results when they are available    Pulmonary follow up is as needed --  Might consider ENT next p first adding max acid suppression next

## 2019-10-02 ENCOUNTER — Encounter: Payer: Self-pay | Admitting: Internal Medicine

## 2019-10-02 NOTE — Assessment & Plan Note (Addendum)
Onset around jan 2021 in setting of unexplained leg weakness  - ST eval 10/14/2018 difficulty initiating swallow of tablet but no evidence of aspiration > rec no dietary restrictions  - Spirometry 05/19/19   FEV1 2.16  (61%)  Ratio 0.76  With min curature on exp f/v     Of the three most common causes of  Sub-acute / recurrent or chronic cough, only one (GERD)  can actually contribute to/ trigger  the other two (asthma and post nasal drip syndrome)  and perpetuate the cylce of cough.  While not intuitively obvious, many patients with chronic low grade reflux do not cough until there is a primary insult that disturbs the protective epithelial barrier and exposes sensitive nerve endings.   This is typically viral but can due to PNDS and  either may apply here.    >>>  The point is that once this occurs, it is difficult to eliminate the cycle   But sometimes something as simple as approp diet/ lifestyle changes and  making pt aware he is doing it and encouraging the use of hard rock candies not containing mint/ menthol products and eliminating pnds with 1st gen H1 blockers per guidelines  Will eliminate it and if not then add max acid suppression next and refer to ENT for f/u.   He does not appear to have a pulmonary problem at this point so f/u can be prn         Each maintenance medication was reviewed in detail including emphasizing most importantly the difference between maintenance and prns and under what circumstances the prns are to be triggered using an action plan format where appropriate.  Total time for H and P, chart review, counseling,  and generating customized AVS unique to this office visit / charting = 30 min

## 2019-10-04 DIAGNOSIS — Z7409 Other reduced mobility: Secondary | ICD-10-CM | POA: Diagnosis not present

## 2019-10-04 DIAGNOSIS — G1221 Amyotrophic lateral sclerosis: Secondary | ICD-10-CM | POA: Diagnosis not present

## 2019-10-04 DIAGNOSIS — R531 Weakness: Secondary | ICD-10-CM | POA: Diagnosis not present

## 2019-10-04 NOTE — Progress Notes (Signed)
Spoke with pt's spouse and notified of results per Dr. Wert. She verbalized understanding and denied any questions. 

## 2019-10-06 DIAGNOSIS — E1140 Type 2 diabetes mellitus with diabetic neuropathy, unspecified: Secondary | ICD-10-CM | POA: Diagnosis not present

## 2019-10-06 DIAGNOSIS — E1165 Type 2 diabetes mellitus with hyperglycemia: Secondary | ICD-10-CM | POA: Diagnosis not present

## 2019-10-06 DIAGNOSIS — E1169 Type 2 diabetes mellitus with other specified complication: Secondary | ICD-10-CM | POA: Diagnosis not present

## 2019-10-06 DIAGNOSIS — E785 Hyperlipidemia, unspecified: Secondary | ICD-10-CM | POA: Diagnosis not present

## 2019-10-13 DIAGNOSIS — M25412 Effusion, left shoulder: Secondary | ICD-10-CM | POA: Diagnosis not present

## 2019-10-13 DIAGNOSIS — I10 Essential (primary) hypertension: Secondary | ICD-10-CM | POA: Diagnosis not present

## 2019-10-13 DIAGNOSIS — M25411 Effusion, right shoulder: Secondary | ICD-10-CM | POA: Diagnosis not present

## 2019-10-13 DIAGNOSIS — M75122 Complete rotator cuff tear or rupture of left shoulder, not specified as traumatic: Secondary | ICD-10-CM | POA: Diagnosis not present

## 2019-10-13 DIAGNOSIS — K219 Gastro-esophageal reflux disease without esophagitis: Secondary | ICD-10-CM | POA: Diagnosis not present

## 2019-10-13 DIAGNOSIS — M75121 Complete rotator cuff tear or rupture of right shoulder, not specified as traumatic: Secondary | ICD-10-CM | POA: Diagnosis not present

## 2019-10-13 DIAGNOSIS — G1221 Amyotrophic lateral sclerosis: Secondary | ICD-10-CM | POA: Diagnosis not present

## 2019-10-13 DIAGNOSIS — E785 Hyperlipidemia, unspecified: Secondary | ICD-10-CM | POA: Diagnosis not present

## 2019-10-15 DIAGNOSIS — M79672 Pain in left foot: Secondary | ICD-10-CM | POA: Diagnosis not present

## 2019-10-15 DIAGNOSIS — M79671 Pain in right foot: Secondary | ICD-10-CM | POA: Diagnosis not present

## 2019-10-20 DIAGNOSIS — C4361 Malignant melanoma of right upper limb, including shoulder: Secondary | ICD-10-CM | POA: Diagnosis not present

## 2019-10-20 DIAGNOSIS — L821 Other seborrheic keratosis: Secondary | ICD-10-CM | POA: Diagnosis not present

## 2019-10-20 DIAGNOSIS — L814 Other melanin hyperpigmentation: Secondary | ICD-10-CM | POA: Diagnosis not present

## 2019-10-20 DIAGNOSIS — L570 Actinic keratosis: Secondary | ICD-10-CM | POA: Diagnosis not present

## 2019-10-20 DIAGNOSIS — D1801 Hemangioma of skin and subcutaneous tissue: Secondary | ICD-10-CM | POA: Diagnosis not present

## 2019-10-20 DIAGNOSIS — D485 Neoplasm of uncertain behavior of skin: Secondary | ICD-10-CM | POA: Diagnosis not present

## 2019-10-20 DIAGNOSIS — C44519 Basal cell carcinoma of skin of other part of trunk: Secondary | ICD-10-CM | POA: Diagnosis not present

## 2019-10-20 DIAGNOSIS — C44722 Squamous cell carcinoma of skin of right lower limb, including hip: Secondary | ICD-10-CM | POA: Diagnosis not present

## 2019-10-20 DIAGNOSIS — Z85828 Personal history of other malignant neoplasm of skin: Secondary | ICD-10-CM | POA: Diagnosis not present

## 2019-10-20 DIAGNOSIS — D225 Melanocytic nevi of trunk: Secondary | ICD-10-CM | POA: Diagnosis not present

## 2019-10-20 DIAGNOSIS — C44712 Basal cell carcinoma of skin of right lower limb, including hip: Secondary | ICD-10-CM | POA: Diagnosis not present

## 2019-10-20 DIAGNOSIS — L720 Epidermal cyst: Secondary | ICD-10-CM | POA: Diagnosis not present

## 2019-10-25 DIAGNOSIS — C61 Malignant neoplasm of prostate: Secondary | ICD-10-CM | POA: Diagnosis not present

## 2019-10-25 DIAGNOSIS — E781 Pure hyperglyceridemia: Secondary | ICD-10-CM | POA: Diagnosis not present

## 2019-10-25 DIAGNOSIS — E162 Hypoglycemia, unspecified: Secondary | ICD-10-CM | POA: Diagnosis not present

## 2019-10-25 DIAGNOSIS — E1165 Type 2 diabetes mellitus with hyperglycemia: Secondary | ICD-10-CM | POA: Diagnosis not present

## 2019-10-25 DIAGNOSIS — R634 Abnormal weight loss: Secondary | ICD-10-CM | POA: Diagnosis not present

## 2019-10-26 DIAGNOSIS — G1221 Amyotrophic lateral sclerosis: Secondary | ICD-10-CM | POA: Diagnosis not present

## 2019-10-26 DIAGNOSIS — M75122 Complete rotator cuff tear or rupture of left shoulder, not specified as traumatic: Secondary | ICD-10-CM | POA: Diagnosis not present

## 2019-10-26 DIAGNOSIS — M75121 Complete rotator cuff tear or rupture of right shoulder, not specified as traumatic: Secondary | ICD-10-CM | POA: Diagnosis not present

## 2019-10-26 DIAGNOSIS — M25411 Effusion, right shoulder: Secondary | ICD-10-CM | POA: Diagnosis not present

## 2019-10-26 DIAGNOSIS — M25412 Effusion, left shoulder: Secondary | ICD-10-CM | POA: Diagnosis not present

## 2019-10-26 DIAGNOSIS — I10 Essential (primary) hypertension: Secondary | ICD-10-CM | POA: Diagnosis not present

## 2019-10-26 DIAGNOSIS — E785 Hyperlipidemia, unspecified: Secondary | ICD-10-CM | POA: Diagnosis not present

## 2019-10-26 DIAGNOSIS — K219 Gastro-esophageal reflux disease without esophagitis: Secondary | ICD-10-CM | POA: Diagnosis not present

## 2019-11-05 DIAGNOSIS — E785 Hyperlipidemia, unspecified: Secondary | ICD-10-CM | POA: Diagnosis not present

## 2019-11-05 DIAGNOSIS — E1165 Type 2 diabetes mellitus with hyperglycemia: Secondary | ICD-10-CM | POA: Diagnosis not present

## 2019-11-05 DIAGNOSIS — E1169 Type 2 diabetes mellitus with other specified complication: Secondary | ICD-10-CM | POA: Diagnosis not present

## 2019-11-05 DIAGNOSIS — E1140 Type 2 diabetes mellitus with diabetic neuropathy, unspecified: Secondary | ICD-10-CM | POA: Diagnosis not present

## 2019-11-09 DIAGNOSIS — C4361 Malignant melanoma of right upper limb, including shoulder: Secondary | ICD-10-CM | POA: Diagnosis not present

## 2019-11-09 DIAGNOSIS — L988 Other specified disorders of the skin and subcutaneous tissue: Secondary | ICD-10-CM | POA: Diagnosis not present

## 2019-11-09 DIAGNOSIS — Z85828 Personal history of other malignant neoplasm of skin: Secondary | ICD-10-CM | POA: Diagnosis not present

## 2019-11-09 DIAGNOSIS — Z85820 Personal history of malignant melanoma of skin: Secondary | ICD-10-CM | POA: Diagnosis not present

## 2019-11-11 DIAGNOSIS — G1221 Amyotrophic lateral sclerosis: Secondary | ICD-10-CM | POA: Diagnosis not present

## 2019-11-11 DIAGNOSIS — I10 Essential (primary) hypertension: Secondary | ICD-10-CM | POA: Diagnosis not present

## 2019-11-11 DIAGNOSIS — M75121 Complete rotator cuff tear or rupture of right shoulder, not specified as traumatic: Secondary | ICD-10-CM | POA: Diagnosis not present

## 2019-11-11 DIAGNOSIS — M25411 Effusion, right shoulder: Secondary | ICD-10-CM | POA: Diagnosis not present

## 2019-11-11 DIAGNOSIS — K219 Gastro-esophageal reflux disease without esophagitis: Secondary | ICD-10-CM | POA: Diagnosis not present

## 2019-11-11 DIAGNOSIS — M75122 Complete rotator cuff tear or rupture of left shoulder, not specified as traumatic: Secondary | ICD-10-CM | POA: Diagnosis not present

## 2019-11-11 DIAGNOSIS — M25412 Effusion, left shoulder: Secondary | ICD-10-CM | POA: Diagnosis not present

## 2019-11-11 DIAGNOSIS — E785 Hyperlipidemia, unspecified: Secondary | ICD-10-CM | POA: Diagnosis not present

## 2019-11-15 DIAGNOSIS — M79672 Pain in left foot: Secondary | ICD-10-CM | POA: Diagnosis not present

## 2019-11-15 DIAGNOSIS — M79671 Pain in right foot: Secondary | ICD-10-CM | POA: Diagnosis not present

## 2019-11-16 DIAGNOSIS — M25411 Effusion, right shoulder: Secondary | ICD-10-CM | POA: Diagnosis not present

## 2019-11-16 DIAGNOSIS — M75122 Complete rotator cuff tear or rupture of left shoulder, not specified as traumatic: Secondary | ICD-10-CM | POA: Diagnosis not present

## 2019-11-16 DIAGNOSIS — I10 Essential (primary) hypertension: Secondary | ICD-10-CM | POA: Diagnosis not present

## 2019-11-16 DIAGNOSIS — G1221 Amyotrophic lateral sclerosis: Secondary | ICD-10-CM | POA: Diagnosis not present

## 2019-11-16 DIAGNOSIS — M75121 Complete rotator cuff tear or rupture of right shoulder, not specified as traumatic: Secondary | ICD-10-CM | POA: Diagnosis not present

## 2019-11-16 DIAGNOSIS — M25412 Effusion, left shoulder: Secondary | ICD-10-CM | POA: Diagnosis not present

## 2019-11-16 DIAGNOSIS — K219 Gastro-esophageal reflux disease without esophagitis: Secondary | ICD-10-CM | POA: Diagnosis not present

## 2019-11-16 DIAGNOSIS — E785 Hyperlipidemia, unspecified: Secondary | ICD-10-CM | POA: Diagnosis not present

## 2019-11-22 DIAGNOSIS — M75121 Complete rotator cuff tear or rupture of right shoulder, not specified as traumatic: Secondary | ICD-10-CM | POA: Diagnosis not present

## 2019-11-22 DIAGNOSIS — K219 Gastro-esophageal reflux disease without esophagitis: Secondary | ICD-10-CM | POA: Diagnosis not present

## 2019-11-22 DIAGNOSIS — M25412 Effusion, left shoulder: Secondary | ICD-10-CM | POA: Diagnosis not present

## 2019-11-22 DIAGNOSIS — M25411 Effusion, right shoulder: Secondary | ICD-10-CM | POA: Diagnosis not present

## 2019-11-22 DIAGNOSIS — M75122 Complete rotator cuff tear or rupture of left shoulder, not specified as traumatic: Secondary | ICD-10-CM | POA: Diagnosis not present

## 2019-11-22 DIAGNOSIS — I10 Essential (primary) hypertension: Secondary | ICD-10-CM | POA: Diagnosis not present

## 2019-11-22 DIAGNOSIS — E785 Hyperlipidemia, unspecified: Secondary | ICD-10-CM | POA: Diagnosis not present

## 2019-11-22 DIAGNOSIS — G1221 Amyotrophic lateral sclerosis: Secondary | ICD-10-CM | POA: Diagnosis not present

## 2019-12-02 DIAGNOSIS — M25511 Pain in right shoulder: Secondary | ICD-10-CM | POA: Diagnosis not present

## 2019-12-02 DIAGNOSIS — M25512 Pain in left shoulder: Secondary | ICD-10-CM | POA: Diagnosis not present

## 2019-12-06 DIAGNOSIS — Z5181 Encounter for therapeutic drug level monitoring: Secondary | ICD-10-CM | POA: Diagnosis not present

## 2019-12-06 DIAGNOSIS — G1221 Amyotrophic lateral sclerosis: Secondary | ICD-10-CM | POA: Diagnosis not present

## 2019-12-06 DIAGNOSIS — E559 Vitamin D deficiency, unspecified: Secondary | ICD-10-CM | POA: Diagnosis not present

## 2019-12-09 DIAGNOSIS — M75122 Complete rotator cuff tear or rupture of left shoulder, not specified as traumatic: Secondary | ICD-10-CM | POA: Diagnosis not present

## 2019-12-09 DIAGNOSIS — M25412 Effusion, left shoulder: Secondary | ICD-10-CM | POA: Diagnosis not present

## 2019-12-09 DIAGNOSIS — I10 Essential (primary) hypertension: Secondary | ICD-10-CM | POA: Diagnosis not present

## 2019-12-09 DIAGNOSIS — M75121 Complete rotator cuff tear or rupture of right shoulder, not specified as traumatic: Secondary | ICD-10-CM | POA: Diagnosis not present

## 2019-12-09 DIAGNOSIS — K219 Gastro-esophageal reflux disease without esophagitis: Secondary | ICD-10-CM | POA: Diagnosis not present

## 2019-12-09 DIAGNOSIS — E785 Hyperlipidemia, unspecified: Secondary | ICD-10-CM | POA: Diagnosis not present

## 2019-12-09 DIAGNOSIS — G1221 Amyotrophic lateral sclerosis: Secondary | ICD-10-CM | POA: Diagnosis not present

## 2019-12-09 DIAGNOSIS — M25411 Effusion, right shoulder: Secondary | ICD-10-CM | POA: Diagnosis not present

## 2019-12-15 DIAGNOSIS — E785 Hyperlipidemia, unspecified: Secondary | ICD-10-CM | POA: Diagnosis not present

## 2019-12-15 DIAGNOSIS — R1310 Dysphagia, unspecified: Secondary | ICD-10-CM | POA: Diagnosis not present

## 2019-12-15 DIAGNOSIS — R531 Weakness: Secondary | ICD-10-CM | POA: Diagnosis not present

## 2019-12-15 DIAGNOSIS — C61 Malignant neoplasm of prostate: Secondary | ICD-10-CM | POA: Diagnosis not present

## 2019-12-15 DIAGNOSIS — E119 Type 2 diabetes mellitus without complications: Secondary | ICD-10-CM | POA: Diagnosis not present

## 2019-12-15 DIAGNOSIS — E1140 Type 2 diabetes mellitus with diabetic neuropathy, unspecified: Secondary | ICD-10-CM | POA: Diagnosis not present

## 2019-12-15 DIAGNOSIS — Z23 Encounter for immunization: Secondary | ICD-10-CM | POA: Diagnosis not present

## 2019-12-15 DIAGNOSIS — M6281 Muscle weakness (generalized): Secondary | ICD-10-CM | POA: Diagnosis not present

## 2019-12-15 DIAGNOSIS — Z1389 Encounter for screening for other disorder: Secondary | ICD-10-CM | POA: Diagnosis not present

## 2019-12-15 DIAGNOSIS — Z0000 Encounter for general adult medical examination without abnormal findings: Secondary | ICD-10-CM | POA: Diagnosis not present

## 2019-12-16 DIAGNOSIS — M79671 Pain in right foot: Secondary | ICD-10-CM | POA: Diagnosis not present

## 2019-12-16 DIAGNOSIS — M79672 Pain in left foot: Secondary | ICD-10-CM | POA: Diagnosis not present

## 2019-12-21 DIAGNOSIS — R350 Frequency of micturition: Secondary | ICD-10-CM | POA: Diagnosis not present

## 2019-12-21 DIAGNOSIS — R351 Nocturia: Secondary | ICD-10-CM | POA: Diagnosis not present

## 2019-12-21 DIAGNOSIS — C61 Malignant neoplasm of prostate: Secondary | ICD-10-CM | POA: Diagnosis not present

## 2019-12-22 ENCOUNTER — Other Ambulatory Visit: Payer: Self-pay | Admitting: Gastroenterology

## 2019-12-28 ENCOUNTER — Encounter (HOSPITAL_COMMUNITY): Payer: Self-pay | Admitting: Gastroenterology

## 2019-12-28 ENCOUNTER — Other Ambulatory Visit: Payer: Self-pay

## 2019-12-30 DIAGNOSIS — M25512 Pain in left shoulder: Secondary | ICD-10-CM | POA: Diagnosis not present

## 2019-12-30 DIAGNOSIS — M25511 Pain in right shoulder: Secondary | ICD-10-CM | POA: Diagnosis not present

## 2020-01-04 ENCOUNTER — Other Ambulatory Visit (HOSPITAL_COMMUNITY)
Admission: RE | Admit: 2020-01-04 | Discharge: 2020-01-04 | Disposition: A | Discharge status: Home / Self Care | Payer: PPO | Source: Ambulatory Visit | Attending: Gastroenterology | Admitting: Gastroenterology

## 2020-01-04 DIAGNOSIS — Z20822 Contact with and (suspected) exposure to covid-19: Secondary | ICD-10-CM | POA: Insufficient documentation

## 2020-01-04 DIAGNOSIS — Z01812 Encounter for preprocedural laboratory examination: Secondary | ICD-10-CM | POA: Insufficient documentation

## 2020-01-04 LAB — SARS CORONAVIRUS 2 (TAT 6-24 HRS): SARS Coronavirus 2: NEGATIVE

## 2020-01-06 ENCOUNTER — Encounter (HOSPITAL_COMMUNITY): Payer: Self-pay | Admitting: Gastroenterology

## 2020-01-06 NOTE — Anesthesia Preprocedure Evaluation (Addendum)
Anesthesia Evaluation  Patient identified by MRN, date of birth, ID band Patient awake    Reviewed: Allergy & Precautions, NPO status , Patient's Chart, lab work & pertinent test results, reviewed documented beta blocker date and time   Airway Mallampati: III  TM Distance: >3 FB Neck ROM: Full    Dental no notable dental hx. (+) Teeth Intact, Caps   Pulmonary    Pulmonary exam normal breath sounds clear to auscultation       Cardiovascular hypertension, Pt. on medications Normal cardiovascular exam Rhythm:Regular Rate:Normal     Neuro/Psych PSYCHIATRIC DISORDERS Depression ALS weakness and atrophy right lower extremity  Neuromuscular disease    GI/Hepatic Neg liver ROS, Dysphagia    Endo/Other  diabetes, Well Controlled, Type 2, Oral Hypoglycemic AgentsHyperlipidemia  Renal/GU negative Renal ROS  negative genitourinary   Musculoskeletal Muscle atrophy right leg   Abdominal (+) + obese,   Peds  Hematology negative hematology ROS (+)   Anesthesia Other Findings   Reproductive/Obstetrics                            Anesthesia Physical Anesthesia Plan  ASA: III  Anesthesia Plan: MAC   Post-op Pain Management:    Induction: Intravenous  PONV Risk Score and Plan: 1 and Propofol infusion and Treatment may vary due to age or medical condition  Airway Management Planned: Natural Airway and Nasal Cannula  Additional Equipment:   Intra-op Plan:   Post-operative Plan:   Informed Consent: I have reviewed the patients History and Physical, chart, labs and discussed the procedure including the risks, benefits and alternatives for the proposed anesthesia with the patient or authorized representative who has indicated his/her understanding and acceptance.     Dental advisory given  Plan Discussed with: CRNA and Anesthesiologist  Anesthesia Plan Comments:        Anesthesia Quick  Evaluation

## 2020-01-07 ENCOUNTER — Encounter (HOSPITAL_COMMUNITY)
Admission: RE | Disposition: A | Discharge status: Home / Self Care | Payer: Self-pay | Source: Home / Self Care | Attending: Gastroenterology

## 2020-01-07 ENCOUNTER — Ambulatory Visit (HOSPITAL_COMMUNITY): Payer: PPO | Admitting: Certified Registered Nurse Anesthetist

## 2020-01-07 ENCOUNTER — Other Ambulatory Visit: Payer: Self-pay

## 2020-01-07 ENCOUNTER — Ambulatory Visit (HOSPITAL_COMMUNITY)
Admission: RE | Admit: 2020-01-07 | Discharge: 2020-01-07 | Disposition: A | Discharge status: Home / Self Care | Payer: PPO | Attending: Gastroenterology | Admitting: Gastroenterology

## 2020-01-07 ENCOUNTER — Encounter (HOSPITAL_COMMUNITY): Payer: Self-pay | Admitting: Gastroenterology

## 2020-01-07 DIAGNOSIS — Z809 Family history of malignant neoplasm, unspecified: Secondary | ICD-10-CM | POA: Insufficient documentation

## 2020-01-07 DIAGNOSIS — R1310 Dysphagia, unspecified: Secondary | ICD-10-CM | POA: Insufficient documentation

## 2020-01-07 DIAGNOSIS — Z888 Allergy status to other drugs, medicaments and biological substances status: Secondary | ICD-10-CM | POA: Insufficient documentation

## 2020-01-07 DIAGNOSIS — Z7984 Long term (current) use of oral hypoglycemic drugs: Secondary | ICD-10-CM | POA: Diagnosis not present

## 2020-01-07 DIAGNOSIS — Z8042 Family history of malignant neoplasm of prostate: Secondary | ICD-10-CM | POA: Diagnosis not present

## 2020-01-07 DIAGNOSIS — E785 Hyperlipidemia, unspecified: Secondary | ICD-10-CM | POA: Diagnosis not present

## 2020-01-07 DIAGNOSIS — R197 Diarrhea, unspecified: Secondary | ICD-10-CM | POA: Diagnosis not present

## 2020-01-07 DIAGNOSIS — Z8546 Personal history of malignant neoplasm of prostate: Secondary | ICD-10-CM | POA: Diagnosis not present

## 2020-01-07 DIAGNOSIS — I10 Essential (primary) hypertension: Secondary | ICD-10-CM | POA: Diagnosis not present

## 2020-01-07 DIAGNOSIS — G1221 Amyotrophic lateral sclerosis: Secondary | ICD-10-CM | POA: Insufficient documentation

## 2020-01-07 DIAGNOSIS — E119 Type 2 diabetes mellitus without complications: Secondary | ICD-10-CM | POA: Diagnosis not present

## 2020-01-07 HISTORY — PX: BIOPSY: SHX5522

## 2020-01-07 HISTORY — PX: ESOPHAGOGASTRODUODENOSCOPY (EGD) WITH PROPOFOL: SHX5813

## 2020-01-07 HISTORY — DX: Amyotrophic lateral sclerosis: G12.21

## 2020-01-07 LAB — GLUCOSE, CAPILLARY
Glucose-Capillary: 325 mg/dL — ABNORMAL HIGH (ref 70–99)
Glucose-Capillary: 365 mg/dL — ABNORMAL HIGH (ref 70–99)
Glucose-Capillary: 290 mg/dL — ABNORMAL HIGH (ref 70–99)
Glucose-Capillary: 293 mg/dL — ABNORMAL HIGH (ref 70–99)

## 2020-01-07 SURGERY — ESOPHAGOGASTRODUODENOSCOPY (EGD) WITH PROPOFOL
Anesthesia: Monitor Anesthesia Care

## 2020-01-07 MED ORDER — LACTATED RINGERS IV SOLN: INTRAVENOUS | Status: DC

## 2020-01-07 MED ORDER — LIDOCAINE 2% (20 MG/ML) 5 ML SYRINGE
INTRAMUSCULAR | Status: DC | PRN
  Administered 2020-01-07: 70 mg via INTRAVENOUS

## 2020-01-07 MED ORDER — INSULIN ASPART 100 UNIT/ML ~~LOC~~ SOLN
8.0000 [IU] | Freq: Once | SUBCUTANEOUS | Status: AC
  Administered 2020-01-07: 8 [IU] via SUBCUTANEOUS

## 2020-01-07 MED ORDER — INSULIN ASPART 100 UNIT/ML ~~LOC~~ SOLN
SUBCUTANEOUS | Status: AC
  Filled 2020-01-07: qty 1

## 2020-01-07 MED ORDER — PROPOFOL 10 MG/ML IV BOLUS
INTRAVENOUS | Status: DC | PRN
  Administered 2020-01-07 (×2): 20 mg via INTRAVENOUS

## 2020-01-07 MED ORDER — PROPOFOL 500 MG/50ML IV EMUL
INTRAVENOUS | Status: DC | PRN
  Administered 2020-01-07: 75 ug/kg/min via INTRAVENOUS

## 2020-01-07 MED ORDER — PROPOFOL 500 MG/50ML IV EMUL
INTRAVENOUS | Status: AC
  Filled 2020-01-07: qty 50

## 2020-01-07 MED ORDER — PROPOFOL 10 MG/ML IV BOLUS
INTRAVENOUS | Status: AC
  Filled 2020-01-07: qty 20

## 2020-01-07 SURGICAL SUPPLY — 15 items

## 2020-01-07 NOTE — Anesthesia Procedure Notes (Signed)
Procedure Name: MAC Date/Time: 01/07/2020 10:08 AM Performed by: West Pugh, CRNA Pre-anesthesia Checklist: Patient identified, Emergency Drugs available, Suction available, Patient being monitored and Timeout performed Patient Re-evaluated:Patient Re-evaluated prior to induction Oxygen Delivery Method: Simple face mask Preoxygenation: Pre-oxygenation with 100% oxygen Induction Type: IV induction Placement Confirmation: positive ETCO2 Dental Injury: Teeth and Oropharynx as per pre-operative assessment  Comments: Biteblock placed gently

## 2020-01-07 NOTE — Discharge Instructions (Signed)
Upper Endoscopy, Adult, Care After  This sheet gives you information about how to care for yourself after your procedure. Your health care provider may also give you more specific instructions. If you have problems or questions, contact your health care provider.  What can I expect after the procedure?  After the procedure, it is common to have:  · A sore throat.  · Mild stomach pain or discomfort.  · Bloating.  · Nausea.  Follow these instructions at home:    · Follow instructions from your health care provider about what to eat or drink after your procedure.  · Return to your normal activities as told by your health care provider. Ask your health care provider what activities are safe for you.  · Take over-the-counter and prescription medicines only as told by your health care provider.  · Do not drive for 24 hours if you were given a sedative during your procedure.  · Keep all follow-up visits as told by your health care provider. This is important.  Contact a health care provider if you have:  · A sore throat that lasts longer than one day.  · Trouble swallowing.  Get help right away if:  · You vomit blood or your vomit looks like coffee grounds.  · You have:  ? A fever.  ? Bloody, black, or tarry stools.  ? A severe sore throat or you cannot swallow.  ? Difficulty breathing.  ? Severe pain in your chest or abdomen.  Summary  · After the procedure, it is common to have a sore throat, mild stomach discomfort, bloating, and nausea.  · Do not drive for 24 hours if you were given a sedative during the procedure.  · Follow instructions from your health care provider about what to eat or drink after your procedure.  · Return to your normal activities as told by your health care provider.  This information is not intended to replace advice given to you by your health care provider. Make sure you discuss any questions you have with your health care provider.  Document Revised: 09/16/2017 Document Reviewed:  08/25/2017  Elsevier Patient Education © 2020 Elsevier Inc.

## 2020-01-07 NOTE — Op Note (Signed)
[facility] Patient Name: Albert Andrews Procedure Date: 01/07/2020 MRN: [id number] Attending MD: Carol Ada , MD Date of Birth: Jan 26, 1951 CSN: [id number] Age: 69 Admit Type: Outpatient Procedure:                Upper GI endoscopy Indications:              Diarrhea Providers:                Carol Ada, MD, Benetta Spar RN, RN, Ladona Ridgel, Technician, Christell Faith, CRNA Referring MD:              Medicines:                Propofol per Anesthesia Complications:            No immediate complications. Estimated Blood Loss:     Estimated blood loss: none. Procedure:                Pre-Anesthesia Assessment:                           - Prior to the procedure, a History and Physical                            was performed, and patient medications and                            allergies were reviewed. The patient's tolerance of                            previous anesthesia was also reviewed. The risks                            and benefits of the procedure and the sedation                            options and risks were discussed with the patient.                            All questions were answered, and informed consent                            was obtained. Prior Anticoagulants: The patient has                            taken no previous anticoagulant or antiplatelet                            agents. ASA Grade Assessment: III - A patient with                            severe systemic disease. After reviewing the risks  and benefits, the patient was deemed in                            satisfactory condition to undergo the procedure.                           - Sedation was administered by an anesthesia                            professional. Deep sedation was attained.                           After obtaining informed consent, the endoscope was                            passed under direct vision.  Throughout the                            procedure, the patient's blood pressure, pulse, and                            oxygen saturations were monitored continuously. The                            [id number]-[id number] ([id number]) was introduced through the                            mouth, and advanced to the third part of duodenum.                            The upper GI endoscopy was accomplished without                            difficulty. The patient tolerated the procedure                            well. Scope In: Scope Out: Findings:      The esophagus was normal.      The stomach was normal.      The examined duodenum was normal. Biopsies for histology were taken with       a cold forceps for evaluation of celiac disease. Impression:               - Normal esophagus.                           - Normal stomach.                           - Normal examined duodenum. Biopsied. Moderate Sedation:      Not Applicable - Patient had care per Anesthesia. Recommendation:           - Patient has a contact number available for                            emergencies. The signs and symptoms  of potential                            delayed complications were discussed with the                            patient. Return to normal activities tomorrow.                            Written discharge instructions were provided to the                            patient.                           - Resume previous diet.                           - Continue present medications.                           - Await pathology results. Procedure Code(s):        --- Professional ---                           [phone], Esophagogastroduodenoscopy, flexible,                            transoral; with biopsy, single or multiple Diagnosis Code(s):        --- Professional ---                           R19.7, Diarrhea, unspecified CPT copyright 2019 American Medical Association. All rights reserved. The codes documented in  this report are preliminary and upon coder review may  be revised to meet current compliance requirements. Carol Ada, MD Carol Ada, MD 01/07/2020 10:36:50 AM This report has been signed electronically. Number of Addenda: 0

## 2020-01-07 NOTE — Transfer of Care (Signed)
Immediate Anesthesia Transfer of Care Note  Patient: Kala Gassmann Sevcik  Procedure(s) Performed: ESOPHAGOGASTRODUODENOSCOPY (EGD) WITH PROPOFOL (N/A ) BIOPSY  Patient Location: PACU and Endoscopy Unit  Anesthesia Type:MAC  Level of Consciousness: awake, alert , oriented and patient cooperative  Airway & Oxygen Therapy: Patient Spontanous Breathing and Patient connected to face mask oxygen  Post-op Assessment: Report given to RN and Post -op Vital signs reviewed and stable  Post vital signs: Reviewed and stable  Last Vitals:  Vitals Value Taken Time  BP    Temp    Pulse 74 01/07/20 1033  Resp 25 01/07/20 1033  SpO2 98 % 01/07/20 1033  Vitals shown include unvalidated device data.  Last Pain:  Vitals:   01/07/20 0823  TempSrc: Oral  PainSc: 0-No pain         Complications: No complications documented.

## 2020-01-07 NOTE — Anesthesia Postprocedure Evaluation (Signed)
Anesthesia Post Note  Patient: Albert Andrews  Procedure(s) Performed: ESOPHAGOGASTRODUODENOSCOPY (EGD) WITH PROPOFOL (N/A ) BIOPSY     Patient location during evaluation: PACU Anesthesia Type: MAC Level of consciousness: awake and alert and oriented Pain management: pain level controlled Vital Signs Assessment: post-procedure vital signs reviewed and stable Respiratory status: spontaneous breathing, nonlabored ventilation and respiratory function stable Cardiovascular status: stable and blood pressure returned to baseline Postop Assessment: no apparent nausea or vomiting Anesthetic complications: no   No complications documented.  Last Vitals:  Vitals:   01/07/20 0831 01/07/20 1035  BP: (!) 185/93 (!) 149/87  Pulse:  73  Resp:  (P) 20  Temp:  (!) (P) 35.4 C  SpO2:  98%    Last Pain:  Vitals:   01/07/20 1035  TempSrc: (P) Temporal  PainSc:                  Waqas Bruhl A.

## 2020-01-07 NOTE — H&P (Signed)
  Albert Andrews HPI:  His last colonoscopy on 11/08/2008 was normal. An EGD was performed for complaints of dysphagia last year and the work up was normal. Dilation was performed with an 18 mm Savary dilator. He is currently being treated for severe complications from DM versus ALS. Remarkably he appears to be improving as he is able to walk with a walker. Compared to last year he was not ambulatory. Per his neurologist there is a correlation with ALS and Celiac disease. The IgA and TTG IgA obtained by his neurologist were normal.   Past Medical History:  Diagnosis Date  . ALS (amyotrophic lateral sclerosis) ([place])   . Depression   . Diabetes mellitus without complication ([place])   . Hyperlipidemia   . Hypertension   . Prostate cancer [facility])     Past Surgical History:  Procedure Laterality Date  . ADENOIDECTOMY, TONSILLECTOMY AND MYRINGOTOMY WITH TUBE PLACEMENT    . PROSTATE BIOPSY    . ROTATOR CUFF REPAIR      Family History  Problem Relation Age of Onset  . Cancer Brother        unknown  . Prostate cancer Neg Hx     Social History:  reports that he has never smoked. He has never used smokeless tobacco. He reports current alcohol use. He reports that he does not use drugs.  Allergies:  Allergies  Allergen Reactions  . Jardiance [Empagliflozin] Diarrhea  . Metformin And Related Diarrhea    Medications:  Scheduled:  Continuous: . lactated ringers 20 mL/hr at 01/07/20 0913    Results for orders placed or performed during the hospital encounter of 01/07/20 (from the past 24 hour(s))  Glucose, capillary     Status: Abnormal   Collection Time: 01/07/20  8:24 AM  Result Value Ref Range   Glucose-Capillary 325 (H) 70 - 99 mg/dL  Glucose, capillary     Status: Abnormal   Collection Time: 01/07/20  9:29 AM  Result Value Ref Range   Glucose-Capillary 365 (H) 70 - 99 mg/dL     No results found.  ROS:  As stated above in the HPI otherwise negative.  Blood pressure  (!) 185/93, pulse 74, temperature 98.6 F (37 C), temperature source Oral, resp. rate (!) 26, height 6' (1.829 m), weight 95.7 kg, SpO2 98 %.    PE: Gen: NAD, Alert and Oriented HEENT:  [place]/AT, EOMI Neck: Supple, no LAD Lungs: CTA Bilaterally CV: RRR without M/G/R ABD: Soft, NTND, +BS Ext: No C/C/E  Assessment/Plan: 1) Diarrhea - EGD with small bowel biopsies.  Albert Andrews D 01/07/2020, 9:31 AM

## 2020-01-10 LAB — SURGICAL PATHOLOGY

## 2020-01-11 ENCOUNTER — Encounter (HOSPITAL_COMMUNITY): Payer: Self-pay | Admitting: Gastroenterology

## 2020-01-15 DIAGNOSIS — M79671 Pain in right foot: Secondary | ICD-10-CM | POA: Diagnosis not present

## 2020-01-15 DIAGNOSIS — M79672 Pain in left foot: Secondary | ICD-10-CM | POA: Diagnosis not present

## 2020-01-21 DIAGNOSIS — R29898 Other symptoms and signs involving the musculoskeletal system: Secondary | ICD-10-CM | POA: Diagnosis not present

## 2020-01-21 DIAGNOSIS — G1229 Other motor neuron disease: Secondary | ICD-10-CM | POA: Diagnosis not present

## 2020-01-21 DIAGNOSIS — R262 Difficulty in walking, not elsewhere classified: Secondary | ICD-10-CM | POA: Diagnosis not present

## 2020-01-25 DIAGNOSIS — E1140 Type 2 diabetes mellitus with diabetic neuropathy, unspecified: Secondary | ICD-10-CM | POA: Diagnosis not present

## 2020-01-25 DIAGNOSIS — C61 Malignant neoplasm of prostate: Secondary | ICD-10-CM | POA: Diagnosis not present

## 2020-01-25 DIAGNOSIS — E1165 Type 2 diabetes mellitus with hyperglycemia: Secondary | ICD-10-CM | POA: Diagnosis not present

## 2020-01-25 DIAGNOSIS — E1169 Type 2 diabetes mellitus with other specified complication: Secondary | ICD-10-CM | POA: Diagnosis not present

## 2020-01-25 DIAGNOSIS — E785 Hyperlipidemia, unspecified: Secondary | ICD-10-CM | POA: Diagnosis not present

## 2020-02-09 DIAGNOSIS — E1144 Type 2 diabetes mellitus with diabetic amyotrophy: Secondary | ICD-10-CM | POA: Diagnosis not present

## 2020-02-09 DIAGNOSIS — R0989 Other specified symptoms and signs involving the circulatory and respiratory systems: Secondary | ICD-10-CM | POA: Diagnosis not present

## 2020-02-09 DIAGNOSIS — R531 Weakness: Secondary | ICD-10-CM | POA: Diagnosis not present

## 2020-02-09 DIAGNOSIS — R29898 Other symptoms and signs involving the musculoskeletal system: Secondary | ICD-10-CM | POA: Diagnosis not present

## 2020-02-09 DIAGNOSIS — Z79899 Other long term (current) drug therapy: Secondary | ICD-10-CM | POA: Diagnosis not present

## 2020-02-09 DIAGNOSIS — M62561 Muscle wasting and atrophy, not elsewhere classified, right lower leg: Secondary | ICD-10-CM | POA: Diagnosis not present

## 2020-02-09 DIAGNOSIS — G6289 Other specified polyneuropathies: Secondary | ICD-10-CM | POA: Diagnosis not present

## 2020-02-09 DIAGNOSIS — G1221 Amyotrophic lateral sclerosis: Secondary | ICD-10-CM | POA: Diagnosis not present

## 2020-02-09 DIAGNOSIS — E1142 Type 2 diabetes mellitus with diabetic polyneuropathy: Secondary | ICD-10-CM | POA: Diagnosis not present

## 2020-02-15 DIAGNOSIS — M79672 Pain in left foot: Secondary | ICD-10-CM | POA: Diagnosis not present

## 2020-02-15 DIAGNOSIS — M79671 Pain in right foot: Secondary | ICD-10-CM | POA: Diagnosis not present

## 2020-02-24 DIAGNOSIS — E1165 Type 2 diabetes mellitus with hyperglycemia: Secondary | ICD-10-CM | POA: Diagnosis not present

## 2020-02-24 DIAGNOSIS — M25512 Pain in left shoulder: Secondary | ICD-10-CM | POA: Diagnosis not present

## 2020-02-24 DIAGNOSIS — M25511 Pain in right shoulder: Secondary | ICD-10-CM | POA: Diagnosis not present

## 2020-03-15 DIAGNOSIS — C61 Malignant neoplasm of prostate: Secondary | ICD-10-CM | POA: Diagnosis not present

## 2020-03-16 DIAGNOSIS — M79672 Pain in left foot: Secondary | ICD-10-CM | POA: Diagnosis not present

## 2020-03-16 DIAGNOSIS — M79671 Pain in right foot: Secondary | ICD-10-CM | POA: Diagnosis not present

## 2020-03-21 DIAGNOSIS — N401 Enlarged prostate with lower urinary tract symptoms: Secondary | ICD-10-CM | POA: Diagnosis not present

## 2020-03-21 DIAGNOSIS — R3912 Poor urinary stream: Secondary | ICD-10-CM | POA: Diagnosis not present

## 2020-03-21 DIAGNOSIS — C61 Malignant neoplasm of prostate: Secondary | ICD-10-CM | POA: Diagnosis not present

## 2020-03-28 ENCOUNTER — Other Ambulatory Visit: Payer: Self-pay

## 2020-03-28 DIAGNOSIS — G1221 Amyotrophic lateral sclerosis: Secondary | ICD-10-CM | POA: Diagnosis not present

## 2020-03-28 DIAGNOSIS — R531 Weakness: Secondary | ICD-10-CM | POA: Diagnosis not present

## 2020-03-28 DIAGNOSIS — Z7409 Other reduced mobility: Secondary | ICD-10-CM | POA: Diagnosis not present

## 2020-03-28 NOTE — Patient Outreach (Signed)
[facility]) Care Management Chronic Special Needs Program  03/28/2020  Name: Albert Andrews DOB: [birth year]  MRN: [id number]  Mr. Taiton Arciero is enrolled in a chronic special needs plan for Diabetes. Chronic Care Management Coordinator telephoned client to review health risk assessment and to develop individualized care plan. Client gave verbal permission to speak with his wife, Helaine Chess regarding his health information.  HIPAA verified by client.   Introduced the chronic care management program, importance of client participation, and taking their care plan to all provider appointments and inpatient facilities.  Wife report client requires assistance with his ADL's/ IADL's.  Wife reports client has ALS.  She states client has a sitter 5 days per week. She states client goes to the gym for weight training with a trainer 5 days per week.  Wife states she provides the overall care and transportation for patient to his appointment. Wife reports client takes his medications as prescribed. Wife states client's blood sugars are checked at least 6 times a day using the free style libre. She reports client's health goal is to work on getting Hgb A1c down.    Goals Addressed              This Visit's Progress   .  "Want to bring A1c down" (pt-stated)        Discussed diabetes self management actions:  Glucose monitoring per provider recommendation  Check feet daily  Visit provider every 3-6 months as directed  Hbg A1C level every 3-6 months.  Eye Exam yearly  Carbohydrate controlled meal planning  Taking diabetes medication as prescribed by provider  Physical activity     .  Client understands the importance of follow-up with providers by attending scheduled visits        Primary care provider:  12/15/19 and 11/05/19 Endocrinologist: 02/24/20, 10/25/19 Continue follow up with your providers as recommended.     .  Client will have ADL/IADL needs addressed in 3 months         Wife reports client is receiving sitter assistance 5 days per week Please contact your RN case manager if  additional community resources are needed.     .  Client will verbalize knowledge of self management of Hypertension as evidences by BP reading of 140/90 or less; or as defined by provider        Plan to check blood pressures regularly.  If you are taking blood pressure medications, take as prescribed Plan to eat low salt and heart healthy meals full of fruits, vegetables, whole grains, lean protein and limit fat and sugars Exercise as recommended by your provider.  RN case manager will send client education article: High blood pressure in adults.     [name]  HEMOGLOBIN A1C < 7        Discussed diabetes self management actions:  Glucose monitoring per provider recommendation  Check feet daily  Visit provider every 3-6 months as directed  Hbg A1C level every 3-6 months.  Eye Exam yearly  Carbohydrate controlled meal planning  Taking diabetes medication as prescribed by provider  Physical activity    Care for elevated blood sugars: Exercise regularly Manage your carbohydrate intake Drink water and stay hydrated Portion control of food Manage your stress levels Monitor your blood sugar levels.       .  Maintain timely refills of diabetic medication as prescribed within the year .        Wife reports client maintains timely refills  of his diabetic medication Contact your RN case manager if you have difficulty obtaining your medication.    .  COMPLETED: Obtain annual  Lipid Profile, LDL-C        Annual lipid profile completed 12/15/19    .  COMPLETED: Obtain Annual Eye (retinal)  Exam         Annual eye exam completed 06/22/19    .  Obtain Annual Foot Exam        Unable to determine annual foot exam RN case manager will send client education article: Diabetes foot care.  Diabetes can affect the nerves in your feet causing decreased feeling or numbness. It is  important that your doctor check your fee regularly.      .  COMPLETED: Obtain annual screen for micro albuminuria (urine) , nephropathy (kidney problems)        Annual micro albuminuria screening completed 12/15/19    .  Obtain Hemoglobin A1C at least 2 times per year        Hgb A1 c completed on 12/15/19     .  Visit Primary Care Provider or Endocrinologist at least 2 times per year         Primary care provider:  12/15/19 and 11/05/19 Endocrinologist: 02/24/20, 10/25/19 Continue follow up with your providers as recommended.        Plan:  Send successful outreach letter with a copy of their individualized care plan, Send individual care plan to provider and Send educational material  Chronic care management coordination will outreach in:  3 Months  [facility] Management will continue to provide services for this member through 04/07/20.  The [facility] care management team will assume care 04/08/2020.     Quinn Plowman RN,BSN,CCM [facility] Care Management [phone]

## 2020-04-04 DIAGNOSIS — G1221 Amyotrophic lateral sclerosis: Secondary | ICD-10-CM | POA: Diagnosis not present

## 2020-04-04 DIAGNOSIS — E1165 Type 2 diabetes mellitus with hyperglycemia: Secondary | ICD-10-CM | POA: Diagnosis not present

## 2020-04-04 DIAGNOSIS — Z7409 Other reduced mobility: Secondary | ICD-10-CM | POA: Diagnosis not present

## 2020-04-04 DIAGNOSIS — R531 Weakness: Secondary | ICD-10-CM | POA: Diagnosis not present

## 2020-04-11 ENCOUNTER — Other Ambulatory Visit: Payer: Self-pay

## 2020-04-16 DIAGNOSIS — M79672 Pain in left foot: Secondary | ICD-10-CM | POA: Diagnosis not present

## 2020-04-16 DIAGNOSIS — M79671 Pain in right foot: Secondary | ICD-10-CM | POA: Diagnosis not present

## 2020-05-01 DIAGNOSIS — Z85828 Personal history of other malignant neoplasm of skin: Secondary | ICD-10-CM | POA: Diagnosis not present

## 2020-05-01 DIAGNOSIS — C44712 Basal cell carcinoma of skin of right lower limb, including hip: Secondary | ICD-10-CM | POA: Diagnosis not present

## 2020-05-01 DIAGNOSIS — L821 Other seborrheic keratosis: Secondary | ICD-10-CM | POA: Diagnosis not present

## 2020-05-01 DIAGNOSIS — L814 Other melanin hyperpigmentation: Secondary | ICD-10-CM | POA: Diagnosis not present

## 2020-05-01 DIAGNOSIS — D225 Melanocytic nevi of trunk: Secondary | ICD-10-CM | POA: Diagnosis not present

## 2020-05-01 DIAGNOSIS — L720 Epidermal cyst: Secondary | ICD-10-CM | POA: Diagnosis not present

## 2020-05-01 DIAGNOSIS — Z85820 Personal history of malignant melanoma of skin: Secondary | ICD-10-CM | POA: Diagnosis not present

## 2020-05-01 DIAGNOSIS — D0461 Carcinoma in situ of skin of right upper limb, including shoulder: Secondary | ICD-10-CM | POA: Diagnosis not present

## 2020-05-02 DIAGNOSIS — M25511 Pain in right shoulder: Secondary | ICD-10-CM | POA: Diagnosis not present

## 2020-05-02 DIAGNOSIS — M25512 Pain in left shoulder: Secondary | ICD-10-CM | POA: Diagnosis not present

## 2020-05-08 DIAGNOSIS — Z7409 Other reduced mobility: Secondary | ICD-10-CM | POA: Diagnosis not present

## 2020-05-08 DIAGNOSIS — G1221 Amyotrophic lateral sclerosis: Secondary | ICD-10-CM | POA: Diagnosis not present

## 2020-05-08 DIAGNOSIS — R531 Weakness: Secondary | ICD-10-CM | POA: Diagnosis not present

## 2020-05-12 DIAGNOSIS — C61 Malignant neoplasm of prostate: Secondary | ICD-10-CM | POA: Diagnosis not present

## 2020-05-17 DIAGNOSIS — M79671 Pain in right foot: Secondary | ICD-10-CM | POA: Diagnosis not present

## 2020-05-17 DIAGNOSIS — M79672 Pain in left foot: Secondary | ICD-10-CM | POA: Diagnosis not present

## 2020-05-18 DIAGNOSIS — R0689 Other abnormalities of breathing: Secondary | ICD-10-CM | POA: Diagnosis not present

## 2020-05-18 DIAGNOSIS — D582 Other hemoglobinopathies: Secondary | ICD-10-CM | POA: Diagnosis not present

## 2020-05-23 DIAGNOSIS — N401 Enlarged prostate with lower urinary tract symptoms: Secondary | ICD-10-CM | POA: Diagnosis not present

## 2020-05-23 DIAGNOSIS — C61 Malignant neoplasm of prostate: Secondary | ICD-10-CM | POA: Diagnosis not present

## 2020-05-23 DIAGNOSIS — R3915 Urgency of urination: Secondary | ICD-10-CM | POA: Diagnosis not present

## 2020-05-29 ENCOUNTER — Encounter: Payer: Self-pay | Admitting: Urology

## 2020-05-29 ENCOUNTER — Other Ambulatory Visit: Payer: Self-pay

## 2020-06-02 ENCOUNTER — Other Ambulatory Visit: Payer: Self-pay

## 2020-06-02 ENCOUNTER — Encounter: Payer: Self-pay | Admitting: Medical Oncology

## 2020-06-02 ENCOUNTER — Ambulatory Visit
Admission: RE | Admit: 2020-06-02 | Discharge: 2020-06-02 | Disposition: A | Discharge status: Home / Self Care | Payer: HMO | Source: Ambulatory Visit | Attending: Urology | Admitting: Urology

## 2020-06-02 DIAGNOSIS — C61 Malignant neoplasm of prostate: Secondary | ICD-10-CM | POA: Diagnosis not present

## 2020-06-02 NOTE — Progress Notes (Signed)
Radiation Oncology         [phone] ________________________________  Outpatient Re-Consultation - Conducted via telephone due to current COVID-19 concerns for limiting Albert exposure  Name: Albert Andrews MRN: [id number]  Date: 06/02/2020  DOB: 11/25/1950  [id number], Denton Ar, Andrews  Albert Andrews*   REFERRING PHYSICIAN: Davis Andrews*  DIAGNOSIS: 70 y.o. gentleman with Stage T1c adenocarcinoma of the prostate with Gleason score of 3+4, and PSA of 0.21 on ADT.    ICD-10-CM   1. Malignant neoplasm of prostate ([place])  C61     HISTORY OF PRESENT ILLNESS: Albert Andrews is a 70 y.o. male with a diagnosis of prostate cancer. He is an established Albert of [facility] followed by Albert Andrews for simple renal cysts and with Albert Andrews for elevated PSA. He was initially seen by Albert Andrews in 2015 for a mildly elevated PSA, which had been stable since that time. He had not had prior prostate biopsy.  A digital rectal examination performed on 04/13/2018 at the time of a follow up visit with Albert Andrews was normal but his PSA on 04/24/2018 was elevated at 7.29. Therefore, the Albert proceeded to transrectal ultrasound with 12 biopsies of the prostate on 05/26/2018.  The prostate volume measured 48.41 cc.  Out of 12 core biopsies, 5 were positive.  The maximum Gleason score was 4+3, and this was seen in the left base lateral with ductal features. Additionally, Gleason 3+3 disease was seen in the left apex lateral, left apex, right apex, and right base lateral.  We met with the Albert on 06/09/2018. At that time, he was also undergoing workup for severe, progressive peripheral neuropathy. He was diagnosed with ALS and thus decided to defer curative treatment and instead proceed with intermittent ADT with Eligard. Since that time, he has continued to be followed with neurology and has since had significant improvement in his neurologic function. In light of this, his neurologist  believes his neuropathy may be more diabetes-related, not likely ALS. With this information and his improvement in his functional status, he and Albert Andrews, who has taken over his care, discussed potentially proceeding with definitive treatment for his prostate cancer.  He underwent a repeat biopsy of the prostate on 05/12/20. The prostate volume measured 32.75 cc. Out of 12 core biopsies, only the left mid lateral core was positive, showing two small foci of Gleason 3+4 disease. His most recent PSA from 03/2020 was 0.21, on Eligard. His last 6 month Eligard injection was 09/20/19.  The Albert reviewed the biopsy results with his urologist and he has kindly been referred today for discussion of potential radiation treatment options.   PREVIOUS RADIATION THERAPY: No  PAST MEDICAL HISTORY:  Past Medical History:  Diagnosis Date  . ALS (amyotrophic lateral sclerosis) ([place])   . Depression   . Diabetes mellitus without complication ([place])   . Hyperlipidemia   . Hypertension   . Prostate cancer ([place])       PAST SURGICAL HISTORY: Past Surgical History:  Procedure Laterality Date  . ADENOIDECTOMY, TONSILLECTOMY AND MYRINGOTOMY WITH TUBE PLACEMENT    . BIOPSY  01/07/2020   Procedure: BIOPSY;  Surgeon: Carol Ada, Andrews;  Location: [facility];  Service: Endoscopy;;  . ESOPHAGOGASTRODUODENOSCOPY (EGD) WITH PROPOFOL N/A 01/07/2020   Procedure: ESOPHAGOGASTRODUODENOSCOPY (EGD) WITH PROPOFOL;  Surgeon: Carol Ada, Andrews;  Location: [facility];  Service: Endoscopy;  Laterality: N/A;  . PROSTATE BIOPSY    . ROTATOR CUFF REPAIR      FAMILY  HISTORY:  Family History  Problem Relation Age of Onset  . Cancer Brother        unknown  . Prostate cancer Neg Hx     SOCIAL HISTORY:  Social History   Socioeconomic History  . Marital status: Married    Spouse name: Not on file  . Number of children: Not on file  . Years of education: Not on file  . Highest education level: Not on file   Occupational History    Comment: retired  Tobacco Use  . Smoking status: Never Smoker  . Smokeless tobacco: Never Used  Vaping Use  . Vaping Use: Never used  Substance and Sexual Activity  . Alcohol use: Yes    Comment: occas  . Drug use: No  . Sexual activity: Not Currently  Other Topics Concern  . Not on file  Social History Narrative  . Not on file   Social Determinants of Health   Financial Resource Strain: Not on file  Food Insecurity: No Food Insecurity  . Worried About Charity fundraiser in the Last Year: Never true  . Ran Out of Food in the Last Year: Never true  Transportation Needs: Not on file  Physical Activity: Not on file  Stress: Not on file  Social Connections: Not on file  Intimate Partner Violence: Not on file    ALLERGIES: Jardiance [empagliflozin] and Metformin and related  MEDICATIONS:  Current Outpatient Medications  Medication Sig Dispense Refill  . Blood Glucose Monitoring Suppl (FREESTYLE FREEDOM LITE) w/Device KIT USE TO CHECK GLUCOSE ONCE DAILY    . Dulaglutide (TRULICITY) 4.91 PH/1.5AV SOPN Inject 0.75 mg into the skin every Sunday.    [name] FREESTYLE LITE test strip USE STRIP TO CHECK GLUCOSE ONCE DAILY    . gabapentin (NEURONTIN) 300 MG capsule Take 600-900 mg by mouth See admin instructions. Take 600 mg in the morning, take 600 mg in the afternoon, and 900 mg at bedtime    . insulin aspart (NOVOLOG) 100 UNIT/ML injection Inject 2-4 Units into the skin 2 (two) times daily before a meal.    . Insulin Degludec (TRESIBA) 100 UNIT/ML SOLN Inject 20 Units into the skin daily.     . Lancets (FREESTYLE) lancets USE TO CHECK GLUCOSE ONCE DAILY    . meclizine (ANTIVERT) 25 MG tablet Take 25 mg by mouth See admin instructions. Take 25 mg at night, may take a 25 mg dose during the day as needed for dizziness    . mirtazapine (REMERON) 15 MG tablet Take 15 mg by mouth at bedtime.    [name] NOVOLOG FLEXPEN 100 UNIT/ML FlexPen Inject into the skin.    [name] OVER THE  COUNTER MEDICATION Take 1 tablet by mouth every 14 (fourteen) days. Balance of nature otc supplement    . simvastatin (ZOCOR) 10 MG tablet Take 10 mg by mouth every evening.     Albert Aas FLEXTOUCH 100 UNIT/ML FlexTouch Pen Inject into the skin.    . pregabalin (LYRICA) 100 MG capsule Take by mouth 2 (two) times daily. (Albert not taking: Reported on 05/29/2020)    . riluzole (RILUTEK) 50 MG tablet Take 50 mg by mouth every 12 (twelve) hours. (Albert not taking: No sig reported)     No current facility-administered medications for this encounter.    REVIEW OF SYSTEMS:  On review of systems, the Albert reports that he is doing well overall. He denies any chest pain, shortness of breath, cough, fevers, chills, night sweats. He denies any  bowel disturbances, and denies abdominal pain, nausea or vomiting. He denies any new musculoskeletal or joint aches or pains. He reports urgency and denies issues emptying his bladder. A complete review of systems is obtained and is otherwise negative.    PHYSICAL EXAM:  Wt Readings from Last 3 Encounters:  12/28/19 211 lb (95.7 kg)  06/09/18 190 lb (86.2 kg)  07/31/13 229 lb (103.9 kg)   Temp Readings from Last 3 Encounters:  01/07/20 (!) 95.7 F (35.4 C) (Temporal)  10/01/19 98.3 F (36.8 C) (Temporal)  06/09/18 98.5 F (36.9 C) (Oral)   BP Readings from Last 3 Encounters:  01/07/20 (!) 145/113  10/01/19 132/80  06/09/18 112/78   Pulse Readings from Last 3 Encounters:  01/07/20 73  10/01/19 72  06/09/18 97    /Unable to assess due to telephone reconsult visit format.   KPS = 70  100 - Normal; no complaints; no evidence of disease. 90   - Able to carry on normal activity; minor signs or symptoms of disease. 80   - Normal activity with effort; some signs or symptoms of disease. 16   - Cares for self; unable to carry on normal activity or to do active work. 60   - Requires occasional assistance, but is able to care for most of his personal  needs. 50   - Requires considerable assistance and frequent medical care. 79   - Disabled; requires special care and assistance. 73   - Severely disabled; hospital admission is indicated although death not imminent. 54   - Very sick; hospital admission necessary; active supportive treatment necessary. 10   - Moribund; fatal processes progressing rapidly. 0     - Dead  Karnofsky DA, Abelmann Newbern, Craver LS and Burchenal [facility] [phone]) The use of the nitrogen mustards in the palliative treatment of carcinoma: with particular reference to bronchogenic carcinoma Cancer 1 634-56  LABORATORY DATA:  Lab Results  Component Value Date   WBC 5.9 07/31/2013   HGB 12.8 (A) 07/31/2013   HCT 39.2 (A) 07/31/2013   MCV 94.8 07/31/2013   PLT 175 03/17/2012   Lab Results  Component Value Date   NA 139 07/31/2013   K 4.0 07/31/2013   CL 103 07/31/2013   CO2 26 07/31/2013   Lab Results  Component Value Date   ALT 79 (H) 07/31/2013   AST 58 (H) 07/31/2013   ALKPHOS 81 07/31/2013   BILITOT 0.9 07/31/2013     RADIOGRAPHY: No results found.    IMPRESSION/PLAN: This visit was conducted via telephone to spare the Albert unnecessary potential exposure in the healthcare setting during the current COVID-19 pandemic. 1. 70 y.o. gentleman with Stage T1c adenocarcinoma of the prostate with Gleason Score of 3+4, and PSA of 0.21 on ADT. We discussed the Albert's workup and outlined the nature of prostate cancer in this setting. The Albert's T stage, Gleason's score, and PSA put him into the intermediate risk group. Accordingly, he is eligible for a variety of potential treatment options including brachytherapy, 5.5 weeks of external radiation or prostatectomy. We discussed the available radiation techniques, and focused on the details and logistics of delivery. We discussed and outlined the risks, benefits, short and long-term effects associated with radiotherapy and compared and contrasted these with  prostatectomy. We discussed the role of SpaceOAR in reducing the rectal toxicity associated with radiotherapy.  He and his wife were encouraged to ask questions that were answered to their stated satisfaction.  At the end of the conversation,  the Albert is interested in moving forward with brachytherapy and use of SpaceOAR to reduce rectal toxicity from radiotherapy.  We will share our discussion with Albert Andrews and move forward with scheduling his CT [facility] planning appointment in the near future.  The Albert will be contacted by Albert Andrews in our office who will be working closely with him to coordinate OR scheduling and pre and post procedure appointments.  We will contact the pharmaceutical rep to ensure that [place] is available at the time of procedure.  We enjoyed meeting with him again today and look forward to continuing to participate in his care.   Given current concerns for Albert exposure during the COVID-19 pandemic, this encounter was conducted via telephone. The Albert was notified in advance and was offered a [place] meeting to allow for face to face communication but unfortunately reported that he did not have the appropriate resources/technology to support such a visit and instead preferred to proceed with telephone consult. The Albert has given verbal consent for this type of encounter. The time spent during this encounter was 45 minutes. The attendants for this meeting include Albert Andrews, Albert Andrews, Albert Andrews, Albert Andrews and his wife Albert Andrews. During the encounter, Albert Andrews, Albert Andrews, and scribe, Wilburn Mylar were located at [place].  Albert Andrews and wife Albert Andrews were located at home.     Nicholos Johns, Andrews    Albert Pita, Andrews  [facility] Direct Dial: [phone]  Fax: [phone] [email or web address]  Skype   LinkedIn   This document serves as a record of services personally performed by Albert Pita, Andrews and Freeman Caldron, Andrews. It was created on their behalf by Wilburn Mylar, a trained medical scribe. The creation of this record is based on the scribe's personal observations and the provider's statements to them. This document has been checked and approved by the attending provider.

## 2020-06-05 ENCOUNTER — Telehealth: Payer: Self-pay | Admitting: *Deleted

## 2020-06-05 NOTE — Telephone Encounter (Signed)
CALLED PATIENT TO ASK QUESTIONS, SPOKE WITH PATIENT 

## 2020-06-14 DIAGNOSIS — M79672 Pain in left foot: Secondary | ICD-10-CM | POA: Diagnosis not present

## 2020-06-14 DIAGNOSIS — M79671 Pain in right foot: Secondary | ICD-10-CM | POA: Diagnosis not present

## 2020-06-15 DIAGNOSIS — K219 Gastro-esophageal reflux disease without esophagitis: Secondary | ICD-10-CM | POA: Diagnosis not present

## 2020-06-15 DIAGNOSIS — E1165 Type 2 diabetes mellitus with hyperglycemia: Secondary | ICD-10-CM | POA: Diagnosis not present

## 2020-06-15 DIAGNOSIS — E1140 Type 2 diabetes mellitus with diabetic neuropathy, unspecified: Secondary | ICD-10-CM | POA: Diagnosis not present

## 2020-06-15 DIAGNOSIS — C61 Malignant neoplasm of prostate: Secondary | ICD-10-CM | POA: Diagnosis not present

## 2020-06-15 DIAGNOSIS — Q6100 Congenital renal cyst, unspecified: Secondary | ICD-10-CM | POA: Diagnosis not present

## 2020-06-15 DIAGNOSIS — M6281 Muscle weakness (generalized): Secondary | ICD-10-CM | POA: Diagnosis not present

## 2020-06-19 DIAGNOSIS — E785 Hyperlipidemia, unspecified: Secondary | ICD-10-CM | POA: Diagnosis not present

## 2020-06-19 DIAGNOSIS — E1169 Type 2 diabetes mellitus with other specified complication: Secondary | ICD-10-CM | POA: Diagnosis not present

## 2020-06-19 DIAGNOSIS — C61 Malignant neoplasm of prostate: Secondary | ICD-10-CM | POA: Diagnosis not present

## 2020-06-19 DIAGNOSIS — E1165 Type 2 diabetes mellitus with hyperglycemia: Secondary | ICD-10-CM | POA: Diagnosis not present

## 2020-06-19 DIAGNOSIS — K219 Gastro-esophageal reflux disease without esophagitis: Secondary | ICD-10-CM | POA: Diagnosis not present

## 2020-06-19 DIAGNOSIS — E1140 Type 2 diabetes mellitus with diabetic neuropathy, unspecified: Secondary | ICD-10-CM | POA: Diagnosis not present

## 2020-06-19 DIAGNOSIS — Z8546 Personal history of malignant neoplasm of prostate: Secondary | ICD-10-CM | POA: Diagnosis not present

## 2020-06-21 DIAGNOSIS — H0011 Chalazion right upper eyelid: Secondary | ICD-10-CM | POA: Diagnosis not present

## 2020-06-21 DIAGNOSIS — H353131 Nonexudative age-related macular degeneration, bilateral, early dry stage: Secondary | ICD-10-CM | POA: Diagnosis not present

## 2020-06-21 DIAGNOSIS — H2513 Age-related nuclear cataract, bilateral: Secondary | ICD-10-CM | POA: Diagnosis not present

## 2020-06-21 DIAGNOSIS — H4911 Fourth [trochlear] nerve palsy, right eye: Secondary | ICD-10-CM | POA: Diagnosis not present

## 2020-06-21 DIAGNOSIS — E119 Type 2 diabetes mellitus without complications: Secondary | ICD-10-CM | POA: Diagnosis not present

## 2020-06-29 ENCOUNTER — Other Ambulatory Visit: Payer: Self-pay | Admitting: Urology

## 2020-06-29 DIAGNOSIS — C61 Malignant neoplasm of prostate: Secondary | ICD-10-CM

## 2020-07-04 ENCOUNTER — Telehealth: Payer: Self-pay | Admitting: *Deleted

## 2020-07-04 NOTE — Telephone Encounter (Signed)
Called patient's wife- Helaine Chess to give new implant date, lvm for a return call

## 2020-07-12 ENCOUNTER — Telehealth: Payer: Self-pay | Admitting: *Deleted

## 2020-07-12 NOTE — Telephone Encounter (Signed)
CALLED PATIENT TO REMIND OF PRE-SEED APPTS. FOR 07-13-20, SPOKE WITH PATIENT'S WIFE- AMY BRADY AND SHE IS AWARE OF THESE APPTS.

## 2020-07-13 ENCOUNTER — Ambulatory Visit (HOSPITAL_COMMUNITY)
Admission: RE | Admit: 2020-07-13 | Discharge: 2020-07-13 | Disposition: A | Discharge status: Home / Self Care | Payer: HMO | Source: Ambulatory Visit | Attending: Urology | Admitting: Urology

## 2020-07-13 ENCOUNTER — Encounter (HOSPITAL_COMMUNITY)
Admission: RE | Admit: 2020-07-13 | Discharge: 2020-07-13 | Disposition: A | Discharge status: Home / Self Care | Payer: HMO | Source: Ambulatory Visit | Attending: Urology | Admitting: Urology

## 2020-07-13 ENCOUNTER — Other Ambulatory Visit: Payer: Self-pay

## 2020-07-13 ENCOUNTER — Encounter: Payer: Self-pay | Admitting: Medical Oncology

## 2020-07-13 ENCOUNTER — Ambulatory Visit
Admission: RE | Admit: 2020-07-13 | Discharge: 2020-07-13 | Disposition: A | Discharge status: Home / Self Care | Payer: HMO | Source: Ambulatory Visit | Attending: Radiation Oncology | Admitting: Radiation Oncology

## 2020-07-13 ENCOUNTER — Ambulatory Visit
Admission: RE | Admit: 2020-07-13 | Discharge: 2020-07-13 | Disposition: A | Discharge status: Home / Self Care | Payer: HMO | Source: Ambulatory Visit | Attending: Urology | Admitting: Urology

## 2020-07-13 DIAGNOSIS — C61 Malignant neoplasm of prostate: Secondary | ICD-10-CM

## 2020-07-13 DIAGNOSIS — J9811 Atelectasis: Secondary | ICD-10-CM | POA: Diagnosis not present

## 2020-07-13 DIAGNOSIS — I10 Essential (primary) hypertension: Secondary | ICD-10-CM | POA: Diagnosis not present

## 2020-07-13 NOTE — Progress Notes (Signed)
  Radiation Oncology         [phone] ________________________________  Name: BASIM BARTNIK MRN: [id number]  Date: 07/13/2020  DOB: 07-30-1950  SIMULATION AND TREATMENT PLANNING NOTE PUBIC ARCH STUDY  [id number], Denton Ar, MD  Davis Gourd*  DIAGNOSIS: 70 y.o. gentleman with Stage T1c adenocarcinoma of the prostate with Gleason score of 3+4, and PSA of 0.21 on ADT.  Oncology History  Malignant neoplasm of prostate ([place])  06/10/2018 Initial Diagnosis   Malignant neoplasm of prostate ([place])   05/12/2020 Cancer Staging   Staging form: Prostate, AJCC 8th Edition - Clinical stage from 05/12/2020: Stage IIB (cT1c, cN0, cM0, PSA: 0.2, Grade Group: 2) - Signed by Freeman Caldron, PA-C on 06/02/2020 Histopathologic type: Adenocarcinoma, NOS Stage prefix: Initial diagnosis Prostate specific antigen (PSA) range: Less than 10 Gleason primary pattern: 3 Gleason secondary pattern: 4 Gleason score: 7 Histologic grading system: 5 grade system Number of biopsy cores examined: 12 Number of biopsy cores positive: 1 Location of positive needle core biopsies: One side       ICD-10-CM   1. Malignant neoplasm of prostate ([place])  C61     COMPLEX SIMULATION:  The patient presented today for evaluation for possible prostate seed implant. He was brought to the radiation planning suite and placed supine on the CT couch. A 3-dimensional image study set was obtained in upload to the planning computer. There, on each axial slice, I contoured the prostate gland. Then, using three-dimensional radiation planning tools I reconstructed the prostate in view of the structures from the transperineal needle pathway to assess for possible pubic arch interference. In doing so, I did not appreciate any pubic arch interference. Also, the patient's prostate volume was estimated based on the drawn structure. The volume was 25 cc.  Given the pubic arch appearance and prostate volume, patient remains a good candidate to  proceed with prostate seed implant. Today, he freely provided informed written consent to proceed.    PLAN: The patient will undergo prostate seed implant.   ________________________________  Sheral Apley. Tammi Klippel, M.D.

## 2020-07-14 NOTE — Progress Notes (Signed)
Routed 2V CXR result done 07-13-2020 to Dr Lovena Neighbours in epic.

## 2020-07-15 DIAGNOSIS — M79672 Pain in left foot: Secondary | ICD-10-CM | POA: Diagnosis not present

## 2020-07-15 DIAGNOSIS — M79671 Pain in right foot: Secondary | ICD-10-CM | POA: Diagnosis not present

## 2020-07-31 DIAGNOSIS — Z8546 Personal history of malignant neoplasm of prostate: Secondary | ICD-10-CM | POA: Diagnosis not present

## 2020-07-31 DIAGNOSIS — K219 Gastro-esophageal reflux disease without esophagitis: Secondary | ICD-10-CM | POA: Diagnosis not present

## 2020-07-31 DIAGNOSIS — C61 Malignant neoplasm of prostate: Secondary | ICD-10-CM | POA: Diagnosis not present

## 2020-07-31 DIAGNOSIS — E785 Hyperlipidemia, unspecified: Secondary | ICD-10-CM | POA: Diagnosis not present

## 2020-07-31 DIAGNOSIS — E1140 Type 2 diabetes mellitus with diabetic neuropathy, unspecified: Secondary | ICD-10-CM | POA: Diagnosis not present

## 2020-07-31 DIAGNOSIS — E1165 Type 2 diabetes mellitus with hyperglycemia: Secondary | ICD-10-CM | POA: Diagnosis not present

## 2020-07-31 DIAGNOSIS — E1169 Type 2 diabetes mellitus with other specified complication: Secondary | ICD-10-CM | POA: Diagnosis not present

## 2020-08-01 DIAGNOSIS — M25512 Pain in left shoulder: Secondary | ICD-10-CM | POA: Diagnosis not present

## 2020-08-01 DIAGNOSIS — M25511 Pain in right shoulder: Secondary | ICD-10-CM | POA: Diagnosis not present

## 2020-08-02 DIAGNOSIS — L814 Other melanin hyperpigmentation: Secondary | ICD-10-CM | POA: Diagnosis not present

## 2020-08-02 DIAGNOSIS — C439 Malignant melanoma of skin, unspecified: Secondary | ICD-10-CM

## 2020-08-02 DIAGNOSIS — D0472 Carcinoma in situ of skin of left lower limb, including hip: Secondary | ICD-10-CM | POA: Diagnosis not present

## 2020-08-02 DIAGNOSIS — D0471 Carcinoma in situ of skin of right lower limb, including hip: Secondary | ICD-10-CM | POA: Diagnosis not present

## 2020-08-02 DIAGNOSIS — D045 Carcinoma in situ of skin of trunk: Secondary | ICD-10-CM | POA: Diagnosis not present

## 2020-08-02 DIAGNOSIS — D225 Melanocytic nevi of trunk: Secondary | ICD-10-CM | POA: Diagnosis not present

## 2020-08-02 DIAGNOSIS — L821 Other seborrheic keratosis: Secondary | ICD-10-CM | POA: Diagnosis not present

## 2020-08-02 DIAGNOSIS — Z85828 Personal history of other malignant neoplasm of skin: Secondary | ICD-10-CM | POA: Diagnosis not present

## 2020-08-02 DIAGNOSIS — C44719 Basal cell carcinoma of skin of left lower limb, including hip: Secondary | ICD-10-CM | POA: Diagnosis not present

## 2020-08-02 DIAGNOSIS — Z85820 Personal history of malignant melanoma of skin: Secondary | ICD-10-CM | POA: Diagnosis not present

## 2020-08-02 DIAGNOSIS — L570 Actinic keratosis: Secondary | ICD-10-CM | POA: Diagnosis not present

## 2020-08-02 HISTORY — DX: Malignant melanoma of skin, unspecified: C43.9

## 2020-08-03 DIAGNOSIS — E1165 Type 2 diabetes mellitus with hyperglycemia: Secondary | ICD-10-CM | POA: Diagnosis not present

## 2020-08-04 DIAGNOSIS — C61 Malignant neoplasm of prostate: Secondary | ICD-10-CM | POA: Diagnosis not present

## 2020-08-04 DIAGNOSIS — R531 Weakness: Secondary | ICD-10-CM | POA: Diagnosis not present

## 2020-08-04 DIAGNOSIS — R351 Nocturia: Secondary | ICD-10-CM | POA: Diagnosis not present

## 2020-08-04 DIAGNOSIS — G1221 Amyotrophic lateral sclerosis: Secondary | ICD-10-CM | POA: Diagnosis not present

## 2020-08-04 DIAGNOSIS — Z7409 Other reduced mobility: Secondary | ICD-10-CM | POA: Diagnosis not present

## 2020-08-14 ENCOUNTER — Telehealth: Payer: Self-pay | Admitting: *Deleted

## 2020-08-14 DIAGNOSIS — M79672 Pain in left foot: Secondary | ICD-10-CM | POA: Diagnosis not present

## 2020-08-14 DIAGNOSIS — M79671 Pain in right foot: Secondary | ICD-10-CM | POA: Diagnosis not present

## 2020-08-14 NOTE — Telephone Encounter (Signed)
Returned patient's phone call, spoke with Helaine Chess

## 2020-08-15 DIAGNOSIS — R531 Weakness: Secondary | ICD-10-CM | POA: Diagnosis not present

## 2020-08-15 DIAGNOSIS — G1221 Amyotrophic lateral sclerosis: Secondary | ICD-10-CM | POA: Diagnosis not present

## 2020-08-24 DIAGNOSIS — E785 Hyperlipidemia, unspecified: Secondary | ICD-10-CM | POA: Diagnosis not present

## 2020-08-24 DIAGNOSIS — E1165 Type 2 diabetes mellitus with hyperglycemia: Secondary | ICD-10-CM | POA: Diagnosis not present

## 2020-08-24 DIAGNOSIS — K219 Gastro-esophageal reflux disease without esophagitis: Secondary | ICD-10-CM | POA: Diagnosis not present

## 2020-08-24 DIAGNOSIS — E1140 Type 2 diabetes mellitus with diabetic neuropathy, unspecified: Secondary | ICD-10-CM | POA: Diagnosis not present

## 2020-08-24 DIAGNOSIS — E1169 Type 2 diabetes mellitus with other specified complication: Secondary | ICD-10-CM | POA: Diagnosis not present

## 2020-08-25 ENCOUNTER — Telehealth: Payer: Self-pay | Admitting: *Deleted

## 2020-08-25 NOTE — Telephone Encounter (Signed)
CALLED PATIENT TO REMIND OF LABS FOR 08-29-20, SPOKE WITH PATIENT'S WIFE- AMY AND SHE IS AWARE OF THIS APPT.

## 2020-08-28 DIAGNOSIS — K219 Gastro-esophageal reflux disease without esophagitis: Secondary | ICD-10-CM | POA: Diagnosis not present

## 2020-08-28 DIAGNOSIS — E119 Type 2 diabetes mellitus without complications: Secondary | ICD-10-CM | POA: Diagnosis not present

## 2020-08-28 DIAGNOSIS — R059 Cough, unspecified: Secondary | ICD-10-CM | POA: Diagnosis not present

## 2020-08-28 DIAGNOSIS — R531 Weakness: Secondary | ICD-10-CM | POA: Diagnosis not present

## 2020-08-29 ENCOUNTER — Encounter (HOSPITAL_BASED_OUTPATIENT_CLINIC_OR_DEPARTMENT_OTHER): Payer: Self-pay | Admitting: Urology

## 2020-08-29 ENCOUNTER — Encounter (HOSPITAL_COMMUNITY)
Admission: RE | Admit: 2020-08-29 | Discharge: 2020-08-29 | Disposition: A | Discharge status: Home / Self Care | Payer: HMO | Source: Ambulatory Visit | Attending: Urology | Admitting: Urology

## 2020-08-29 ENCOUNTER — Other Ambulatory Visit: Payer: Self-pay

## 2020-08-29 DIAGNOSIS — Z01812 Encounter for preprocedural laboratory examination: Secondary | ICD-10-CM | POA: Insufficient documentation

## 2020-08-29 LAB — CBC
HCT: 43.2 % (ref 39.0–52.0)
Hemoglobin: 14.9 g/dL (ref 13.0–17.0)
MCH: 31.6 pg (ref 26.0–34.0)
MCHC: 34.5 g/dL (ref 30.0–36.0)
MCV: 91.5 fL (ref 80.0–100.0)
Platelets: 122 10*3/uL — ABNORMAL LOW (ref 150–400)
RBC: 4.72 MIL/uL (ref 4.22–5.81)
RDW: 13.6 % (ref 11.5–15.5)
WBC: 7.8 10*3/uL (ref 4.0–10.5)
nRBC: 0 % (ref 0.0–0.2)

## 2020-08-29 LAB — COMPREHENSIVE METABOLIC PANEL
ALT: 73 U/L — ABNORMAL HIGH (ref 0–44)
AST: 83 U/L — ABNORMAL HIGH (ref 15–41)
Albumin: 4 g/dL (ref 3.5–5.0)
Alkaline Phosphatase: 86 U/L (ref 38–126)
Anion gap: 8 (ref 5–15)
BUN: 15 mg/dL (ref 8–23)
CO2: 25 mmol/L (ref 22–32)
Calcium: 8.9 mg/dL (ref 8.9–10.3)
Chloride: 106 mmol/L (ref 98–111)
Creatinine, Ser: 0.76 mg/dL (ref 0.61–1.24)
GFR, Estimated: >60 mL/min (ref >60)
Glucose, Bld: 246 mg/dL — ABNORMAL HIGH (ref 70–99)
Potassium: 3.8 mmol/L (ref 3.5–5.1)
Sodium: 139 mmol/L (ref 135–145)
Total Bilirubin: 0.5 mg/dL (ref 0.3–1.2)
Total Protein: 7.6 g/dL (ref 6.5–8.1)

## 2020-08-29 LAB — PROTIME-INR
INR: 1.2 (ref 0.8–1.2)
Prothrombin Time: 15 seconds (ref 11.4–15.2)

## 2020-08-29 LAB — APTT: aPTT: 34 seconds (ref 24–36)

## 2020-08-29 NOTE — Progress Notes (Addendum)
Spoke w/ via phone for pre-op interview---pt wife amy brady per pt request Lab needs dos----   None has lab appt 08-29-2020 for cbc cmp pt ptt at 1530            Lab results------ekg 07-13-2020 epic, chest xray [id number] epic COVID test -----patient wife amy  states asymptomatic no test needed Arrive at -------1100 am 09-01-2020 NPO after MN NO Solid Food.  Clear liquids from MN until---1000 am then npo Med rec completed Medications to take morning of surgery -----pregabalyn,  TAMSULOSIN, REFLUX PILL Diabetic medication -----TAKE 1/2 HS DOSE TRESIBA NIGHT BEFORE SURGERY (TAKE 27 UNITS), NO DIABETICS MNEDS OR INSULIN DAY OF SURGERY Patient instructed to bring photo id and insurance card day of surgery Patient aware to have Driver (ride ) / caregiver  WIFE AMY BRADY CELL [phone] CELL WILL DROP PT OFF   for 24 hours after surgery  Patient Special Instructions -----FLEETS ENEMA AM OF SURGERY Pre-Op special Istructions -----NONE Patient verbalized understanding of instructions that were given at this phone interview. Patient denies shortness of breath, chest pain, fever, cough at this phone interview.  PT USES POWER WHEEL CHAIR TRANSFERS MINIMAL ASSIST  PT HAS FREE STYLE LIBRA ON LEFT ARM, WILL BRING EXTRA FREE STYLE LIBRA FOR DAY OF SURGERY  als ruled out dr caress md care everywher lov 05-18-2020 note on chart/care everywhere  CBC RESULTS ROUTED TO DR WINTER VIA Epic(PLATLET COUNT 122 ON LABS TODAY, LEFT MESSAGE WITH CONI MABE TO TEEL DR WINTER PLATLET COUNT 122 ALSO ON LABS TODAY

## 2020-08-31 ENCOUNTER — Telehealth: Payer: Self-pay | Admitting: *Deleted

## 2020-08-31 NOTE — Telephone Encounter (Signed)
CALLED PATIENT TO REMIND OF PROCEDURE FOR 09-01-20, SPOKE WITH PATIENT'S WIFE AMY AND SHE IS AWARE OF THIS PROCEDURE

## 2020-09-01 ENCOUNTER — Ambulatory Visit (HOSPITAL_COMMUNITY): Payer: HMO

## 2020-09-01 ENCOUNTER — Ambulatory Visit (HOSPITAL_BASED_OUTPATIENT_CLINIC_OR_DEPARTMENT_OTHER): Payer: HMO | Admitting: Anesthesiology

## 2020-09-01 ENCOUNTER — Ambulatory Visit (HOSPITAL_BASED_OUTPATIENT_CLINIC_OR_DEPARTMENT_OTHER)
Admission: RE | Admit: 2020-09-01 | Discharge: 2020-09-01 | Disposition: A | Discharge status: Home / Self Care | Payer: HMO | Source: Other Acute Inpatient Hospital | Attending: Urology | Admitting: Urology

## 2020-09-01 ENCOUNTER — Encounter (HOSPITAL_BASED_OUTPATIENT_CLINIC_OR_DEPARTMENT_OTHER)
Admission: RE | Disposition: A | Discharge status: Home / Self Care | Payer: Self-pay | Source: Other Acute Inpatient Hospital | Attending: Urology

## 2020-09-01 ENCOUNTER — Other Ambulatory Visit: Payer: Self-pay

## 2020-09-01 ENCOUNTER — Encounter (HOSPITAL_BASED_OUTPATIENT_CLINIC_OR_DEPARTMENT_OTHER): Payer: Self-pay | Admitting: Urology

## 2020-09-01 DIAGNOSIS — R351 Nocturia: Secondary | ICD-10-CM | POA: Insufficient documentation

## 2020-09-01 DIAGNOSIS — G629 Polyneuropathy, unspecified: Secondary | ICD-10-CM | POA: Insufficient documentation

## 2020-09-01 DIAGNOSIS — K219 Gastro-esophageal reflux disease without esophagitis: Secondary | ICD-10-CM | POA: Diagnosis not present

## 2020-09-01 DIAGNOSIS — C61 Malignant neoplasm of prostate: Secondary | ICD-10-CM | POA: Diagnosis not present

## 2020-09-01 DIAGNOSIS — E119 Type 2 diabetes mellitus without complications: Secondary | ICD-10-CM | POA: Diagnosis not present

## 2020-09-01 DIAGNOSIS — I10 Essential (primary) hypertension: Secondary | ICD-10-CM | POA: Diagnosis not present

## 2020-09-01 HISTORY — DX: Other symptoms and signs involving the musculoskeletal system: R29.898

## 2020-09-01 HISTORY — DX: Frequency of micturition: R35.0

## 2020-09-01 HISTORY — PX: SPACE OAR INSTILLATION: SHX6769

## 2020-09-01 HISTORY — DX: Gastro-esophageal reflux disease without esophagitis: K21.9

## 2020-09-01 HISTORY — DX: Type 2 diabetes mellitus without complications: E11.9

## 2020-09-01 HISTORY — PX: RADIOACTIVE SEED IMPLANT: SHX5150

## 2020-09-01 LAB — GLUCOSE, CAPILLARY
Glucose-Capillary: 216 mg/dL — ABNORMAL HIGH (ref 70–99)
Glucose-Capillary: 150 mg/dL — ABNORMAL HIGH (ref 70–99)

## 2020-09-01 SURGERY — INSERTION, RADIATION SOURCE, PROSTATE
Anesthesia: General | Site: Rectum

## 2020-09-01 MED ORDER — FENTANYL CITRATE (PF) 100 MCG/2ML IJ SOLN: 25.0000 ug | INTRAMUSCULAR | Status: DC | PRN

## 2020-09-01 MED ORDER — DEXAMETHASONE SODIUM PHOSPHATE 10 MG/ML IJ SOLN
INTRAMUSCULAR | Status: DC | PRN
  Administered 2020-09-01: 5 mg via INTRAVENOUS

## 2020-09-01 MED ORDER — FLEET ENEMA 7-19 GM/118ML RE ENEM: 1.0000 | ENEMA | Freq: Once | RECTAL | Status: DC

## 2020-09-01 MED ORDER — ONDANSETRON HCL 4 MG/2ML IJ SOLN
INTRAMUSCULAR | Status: AC
  Filled 2020-09-01: qty 2

## 2020-09-01 MED ORDER — MIDAZOLAM HCL 2 MG/2ML IJ SOLN
INTRAMUSCULAR | Status: AC
  Filled 2020-09-01: qty 2

## 2020-09-01 MED ORDER — LIDOCAINE 2% (20 MG/ML) 5 ML SYRINGE
INTRAMUSCULAR | Status: DC | PRN
  Administered 2020-09-01: 100 mg via INTRAVENOUS

## 2020-09-01 MED ORDER — FENTANYL CITRATE (PF) 100 MCG/2ML IJ SOLN
INTRAMUSCULAR | Status: DC | PRN
  Administered 2020-09-01: 25 ug via INTRAVENOUS
  Administered 2020-09-01: 50 ug via INTRAVENOUS

## 2020-09-01 MED ORDER — LACTATED RINGERS IV SOLN: INTRAVENOUS | Status: DC

## 2020-09-01 MED ORDER — TRAMADOL HCL 50 MG PO TABS: 50.0000 mg | ORAL_TABLET | Freq: Four times a day (QID) | ORAL | 0 refills | Status: AC | PRN

## 2020-09-01 MED ORDER — SODIUM CHLORIDE (PF) 0.9 % IJ SOLN
INTRAMUSCULAR | Status: DC | PRN
  Administered 2020-09-01: 2 mL via INTRAVENOUS

## 2020-09-01 MED ORDER — STERILE WATER FOR IRRIGATION IR SOLN
Status: DC | PRN
  Administered 2020-09-01: 500 mL

## 2020-09-01 MED ORDER — CIPROFLOXACIN IN D5W 400 MG/200ML IV SOLN
INTRAVENOUS | Status: AC
  Filled 2020-09-01: qty 200

## 2020-09-01 MED ORDER — DEXAMETHASONE SODIUM PHOSPHATE 10 MG/ML IJ SOLN
INTRAMUSCULAR | Status: AC
  Filled 2020-09-01: qty 1

## 2020-09-01 MED ORDER — PROPOFOL 10 MG/ML IV BOLUS
INTRAVENOUS | Status: DC | PRN
  Administered 2020-09-01: 150 mg via INTRAVENOUS

## 2020-09-01 MED ORDER — PROPOFOL 10 MG/ML IV BOLUS
INTRAVENOUS | Status: AC
  Filled 2020-09-01: qty 20

## 2020-09-01 MED ORDER — ONDANSETRON HCL 4 MG/2ML IJ SOLN
INTRAMUSCULAR | Status: DC | PRN
  Administered 2020-09-01: 4 mg via INTRAVENOUS

## 2020-09-01 MED ORDER — LIDOCAINE 2% (20 MG/ML) 5 ML SYRINGE
INTRAMUSCULAR | Status: AC
  Filled 2020-09-01: qty 10

## 2020-09-01 MED ORDER — CIPROFLOXACIN IN D5W 400 MG/200ML IV SOLN
400.0000 mg | INTRAVENOUS | Status: AC
  Administered 2020-09-01: 400 mg via INTRAVENOUS

## 2020-09-01 MED ORDER — ONDANSETRON HCL 4 MG/2ML IJ SOLN: 4.0000 mg | Freq: Once | INTRAMUSCULAR | Status: DC | PRN

## 2020-09-01 MED ORDER — ACETAMINOPHEN 10 MG/ML IV SOLN: 1000.0000 mg | Freq: Once | INTRAVENOUS | Status: DC | PRN

## 2020-09-01 MED ORDER — FENTANYL CITRATE (PF) 100 MCG/2ML IJ SOLN
INTRAMUSCULAR | Status: AC
  Filled 2020-09-01: qty 2

## 2020-09-01 MED ORDER — IOHEXOL 300 MG/ML  SOLN
INTRAMUSCULAR | Status: DC | PRN
  Administered 2020-09-01: 7 mL

## 2020-09-01 MED ORDER — SODIUM CHLORIDE 0.9 % IR SOLN
Status: DC | PRN
  Administered 2020-09-01: 200 mL

## 2020-09-01 SURGICAL SUPPLY — 38 items
BAG DRN RND TRDRP ANRFLXCHMBR (UROLOGICAL SUPPLIES) ×2
BAG URINE DRAIN 2000ML AR STRL (UROLOGICAL SUPPLIES) ×3 IMPLANT
BLADE CLIPPER SENSICLIP SURGIC (BLADE) ×3 IMPLANT
CATH FOLEY 2WAY SLVR  5CC 16FR (CATHETERS) ×3
CATH FOLEY 2WAY SLVR 5CC 16FR (CATHETERS) ×2 IMPLANT
CATH ROBINSON RED A/P 16FR (CATHETERS) IMPLANT
CATH ROBINSON RED A/P 20FR (CATHETERS) ×3 IMPLANT
CLOTH BEACON ORANGE TIMEOUT ST (SAFETY) ×3 IMPLANT
CNTNR URN SCR LID CUP LEK RST (MISCELLANEOUS) ×4 IMPLANT
CONT SPEC 4OZ STRL OR WHT (MISCELLANEOUS) ×6
COVER BACK TABLE 60X90IN (DRAPES) ×3 IMPLANT
COVER MAYO STAND STRL (DRAPES) ×3 IMPLANT
DRAPE C-ARM 35X43 STRL (DRAPES) ×2 IMPLANT
DRSG TEGADERM 4X4.75 (GAUZE/BANDAGES/DRESSINGS) ×6 IMPLANT
DRSG TEGADERM 8X12 (GAUZE/BANDAGES/DRESSINGS) ×5 IMPLANT
GAUZE SPONGE 4X4 12PLY STRL (GAUZE/BANDAGES/DRESSINGS) ×1 IMPLANT
GLOVE SURG ENC MOIS LTX SZ6.5 (GLOVE) ×3 IMPLANT
GLOVE SURG ENC MOIS LTX SZ7.5 (GLOVE) ×3 IMPLANT
GLOVE SURG ENC MOIS LTX SZ8 (GLOVE) IMPLANT
GLOVE SURG ORTHO LTX SZ8.5 (GLOVE) ×5 IMPLANT
GLOVE SURG POLYISO LF SZ6.5 (GLOVE) IMPLANT
GOWN STRL REUS W/TWL LRG LVL3 (GOWN DISPOSABLE) ×5 IMPLANT
GOWN STRL REUS W/TWL XL LVL3 (GOWN DISPOSABLE) ×3 IMPLANT
HOLDER FOLEY CATH W/STRAP (MISCELLANEOUS) IMPLANT
I-SEED ×73 IMPLANT
IMPL SPACEOAR VUE SYSTEM (Spacer) IMPLANT
IMPLANT SPACEOAR VUE SYSTEM (Spacer) ×3 IMPLANT
IV NS 1000ML (IV SOLUTION) ×3
IV NS 1000ML BAXH (IV SOLUTION) ×2 IMPLANT
KIT TURNOVER CYSTO (KITS) ×3 IMPLANT
MARKER SKIN DUAL TIP RULER LAB (MISCELLANEOUS) ×3 IMPLANT
PACK CYSTO (CUSTOM PROCEDURE TRAY) ×3 IMPLANT
SURGILUBE 2OZ TUBE FLIPTOP (MISCELLANEOUS) ×3 IMPLANT
SUT BONE WAX W31G (SUTURE) IMPLANT
SYR 10ML LL (SYRINGE) ×4 IMPLANT
TOWEL OR 17X26 10 PK STRL BLUE (TOWEL DISPOSABLE) ×3 IMPLANT
UNDERPAD 30X36 HEAVY ABSORB (UNDERPADS AND DIAPERS) ×6 IMPLANT
WATER STERILE IRR 500ML POUR (IV SOLUTION) ×3 IMPLANT

## 2020-09-01 NOTE — Anesthesia Procedure Notes (Signed)
Procedure Name: LMA Insertion Date/Time: 09/01/2020 3:24 PM Performed by: Suan Halter, CRNA Pre-anesthesia Checklist: Patient identified, Emergency Drugs available, Suction available and Patient being monitored Patient Re-evaluated:Patient Re-evaluated prior to induction Oxygen Delivery Method: Circle system utilized Preoxygenation: Pre-oxygenation with 100% oxygen Induction Type: IV induction Ventilation: Mask ventilation without difficulty LMA: LMA inserted LMA Size: 4.0 Number of attempts: 1 Airway Equipment and Method: Bite block Placement Confirmation: positive ETCO2 Tube secured with: Tape Dental Injury: Teeth and Oropharynx as per pre-operative assessment

## 2020-09-01 NOTE — Progress Notes (Signed)
  Radiation Oncology         [phone] ________________________________  Name: Albert Andrews MRN: [id number]  Date: 09/01/2020  DOB: 08/08/1950       Prostate Seed Implant  OQ:HUTMLY, Denton Ar, MD  No ref. provider found  DIAGNOSIS:  70 y.o. gentleman with Stage T1c adenocarcinoma of the prostate with Gleason score of 3+4, and PSA of 0.21 on ADT.  Oncology History  Malignant neoplasm of prostate ([place])  06/10/2018 Initial Diagnosis   Malignant neoplasm of prostate ([place])   05/12/2020 Cancer Staging   Staging form: Prostate, AJCC 8th Edition - Clinical stage from 05/12/2020: Stage IIB (cT1c, cN0, cM0, PSA: 0.2, Grade Group: 2) - Signed by Freeman Caldron, PA-C on 06/02/2020 Histopathologic type: Adenocarcinoma, NOS Stage prefix: Initial diagnosis Prostate specific antigen (PSA) range: Less than 10 Gleason primary pattern: 3 Gleason secondary pattern: 4 Gleason score: 7 Histologic grading system: 5 grade system Number of biopsy cores examined: 12 Number of biopsy cores positive: 1 Location of positive needle core biopsies: One side     No diagnosis found.  PROCEDURE: Insertion of radioactive I-125 seeds into the prostate gland.  RADIATION DOSE: 145 Gy, definitive therapy.  TECHNIQUE: Albert Andrews was brought to the operating room with the urologist. He was placed in the dorsolithotomy position. He was catheterized and a rectal tube was inserted. The perineum was shaved, prepped and draped. The ultrasound probe was then introduced into the rectum to see the prostate gland.  TREATMENT DEVICE: A needle grid was attached to the ultrasound probe stand and anchor needles were placed.  3D PLANNING: The prostate was imaged in 3D using a sagittal sweep of the prostate probe. These images were transferred to the planning computer. There, the prostate, urethra and rectum were defined on each axial reconstructed image. Then, the software created an optimized 3D plan and a few seed positions  were adjusted. The quality of the plan was reviewed using [facility] information for the target and the following two organs at risk:  Urethra and Rectum.  Then the accepted plan was printed and handed off to the radiation therapist.  Under my supervision, the custom loading of the seeds and spacers was carried out and loaded into sealed vicryl sleeves.  These pre-loaded needles were then placed into the needle holder.[name]  PROSTATE VOLUME STUDY:  Using transrectal ultrasound the volume of the prostate was verified to be 32.8 cc.  SPECIAL TREATMENT PROCEDURE/SUPERVISION AND HANDLING: The pre-loaded needles were then delivered under sagittal guidance. A total of 20 needles were used to deposit 73 seeds in the prostate gland. The individual seed activity was 0.347 mCi.  SpaceOAR:  Yes  COMPLEX SIMULATION: At the end of the procedure, an anterior radiograph of the pelvis was obtained to document seed positioning and count. Cystoscopy was performed to check the urethra and bladder.  MICRODOSIMETRY: At the end of the procedure, the patient was emitting 0.071 mR/hr at 1 meter. Accordingly, he was considered safe for hospital discharge.  PLAN: The patient will return to the radiation oncology clinic for post implant CT dosimetry in three weeks.   ________________________________  Sheral Apley Tammi Klippel, M.D.

## 2020-09-01 NOTE — Discharge Instructions (Signed)
PROSTATE CANCER TREATMENT WITH RADIOACTIVE IODINE-125 SEED IMPLANT  This instruction sheet is intended to discuss implantation of Iodine-125 seeds as treatment for cancer of the prostate. It will explain in detail what you may expect from this treatment and what precautions are necessary as a result of the treatment. Iodine-125 emits a relatively low energy radiation. The radioactive seeds are surgically implanted directly into the prostate gland. Most of the radiation is contained within the prostate gland. A very small amount is present outside the body.The precautions that we ask you to take are to ensure that those around you are protected from unnecessary radiation. The principles of radiation safety that you need to understand are:  DISTANCE: The further a person is from the radioactive implant the less radiation they will be receiving. The amount of radiation received falls off quite rapidly with distance. More specific guidelines are given in the table on the last page.  TIME: The amount of radiation a person is exposed to is directly proportional to the amount of time that is spent in close proximity to the radioactive implant. Very little radiation will be received during short periods. See the table on the last page for more specific guideline.  CHILDREN UNDER AGE 26 Children should not be allowed to sit on your lap or otherwise be in very close contact for more than a few minutes for the first 6-8 weeks following the implant. You may affectionately greet (hug/kiss) a child for a short period of time, but remember, the longer you are in close proximity with that child the more radiation they are being exposed to. At a distance of 6 feet there is no limit to the length of time you may spend together. See specific guidelines on the last page.  PREGNANT OR POSSIBLY PREGNANT WOMEN Pregnant women should avoid prolonged close physical contact with you for the first 6-8 weeks after implant. At a  distance of 6 feet there is no limit to the length of time you may spend together. Pregnant women or possibly pregnant women can safely be in close contact with you for a limited period of time. See the last page for guidelines.  FAMILY RELATIONS You may sleep in the same bed as your partner (provided she is not pregnant or under the age of 60). Sexual intercourse, using a condom, may be resumed 2 weeks after the implant. Your semen may be discolored, dark brown or black. This is normal and is the result of bleeding that may have occurred during the implant. After 3-4 weeks it will not be necessary to use a condom.  DAILY ACTIVITIES You may resume normal activities in a few days (example: work, shopping, church) without the risk of harmful radiation exposure to those around you provided you keep in mind the time and distance precautions. Objects that you touch or item that you use do not become radioactive. Linens, clothing, tableware, and dishes may be used by other persons without special precautions. Your bodily wastes (urine and stool) are not radioactive.  SPECIAL PRECAUTIONS It is possible to lose implanted Iodine-125 seed(s) through urination. Although it is possible to pass seeds indefinitely, it is most likely to occur immediately after catheter removal. To prevent this from happening the catheter that was in place during the implant procedure is removed immediately after the implant and a cystoscopy procedure is performed. The process of removing the catheter and the cystoscopy procedure should dislodge and remove any seeds that are not firmly imbedded in the prostate  tissue. However, you should watch for seeds if/when you remove your catheter at home. The seeds are silver colored and the size of a grain of rice. In the unlikely event that a seed is seen after urination, simply flush the seed down the toilet. The seed should not be handled with your fingers, not even with a glove or napkin. A  spoon or tweezers can be used to pick up a seed. The Radiation Oncology department is open Monday - Friday from 8:00 am to 5:30 pm with a Radiation Oncologist on call at all times. He or she may be reached by calling [phone]. If you are to be hospitalized or if death should occur, your family should notify the Runner, broadcasting/film/video.  SIDE EFFECTS There are very few side effects associate with the implant procedure. Minor burning with urination, weak stream, hesitancy, intermittency, frequency, mild pain or feeling unable to pass your urine freely are common and usually stop in one to four months. If these symptoms are extremely uncomfortable, contact your physician.  RADIATION SAFETY GUIDELINES PROSTATE CANCER TREATMENT WITH RADIOACTIVE IODINE-125 SEED IMPLANT  The following guidelines will limit exposure to less than naturally occurring background radiation.  PERSONS AGE 24-45 (if able to become pregnant)  FOR 8 WEEKS FOLLOWING IMPLANT  At a distance of 1 foot: limit time to less than 2 hours/week At a distance of 3 feet: limit time to 20 hours/week At a distance of 6 feet: no restrictions  AFTER 8 WEEKS No restrictions  CHILDREN UNDER AGE 24, PREGNANT WOMEN OR POSSIBLY PREGNANT WOMEN  FOR 8 WEEKS FOLLOWING IMPLANT At a distance of 1 foot: limit time to 10 minutes/week At a distance of 3 feet: limit time to 2 hours/week At a distance of 6 feet: no restrictions  AFTER 8 WEEKS No restrictions  PERSONS OVER THE AGE OF 45 AND DO NOT EXPECT TO HAVE ANY MORE CHILDREN No restrictions  Updated by S[facility] in January 2020  Post Anesthesia Home Care Instructions  Activity: Get plenty of rest for the remainder of the day. A responsible individual must stay with you for 24 hours following the procedure.  For the next 24 hours, DO NOT: -Drive a car -Paediatric nurse -Drink alcoholic beverages -Take any medication unless instructed by your physician -Make any legal decisions  or sign important papers.  Meals: Start with liquid foods such as gelatin or soup. Progress to regular foods as tolerated. Avoid greasy, spicy, heavy foods. If nausea and/or vomiting occur, drink only clear liquids until the nausea and/or vomiting subsides. Call your physician if vomiting continues.  Special Instructions/Symptoms: Your throat may feel dry or sore from the anesthesia or the breathing tube placed in your throat during surgery. If this causes discomfort, gargle with warm salt water. The discomfort should disappear within 24 hours.

## 2020-09-01 NOTE — H&P (Signed)
PRE-OP H&P  Office Visit Report     08/04/2020   --------------------------------------------------------------------------------   Albert Andrews  MRN: [id number]  DOB: Aug 19, 1950, 70 year old Male  PRIMARY CARE:  Wenda Low, MD  REFERRING:  Harrell Gave A. Lovena Neighbours, MD  PROVIDER:  Ellison Hughs, M.D.  TREATING:  Jiles Crocker, NP  LOCATION:  [facility] [phone]     --------------------------------------------------------------------------------   CC/HPI: Prostate cancer   Albert Andrews is a 70 year-old male with a history of T1c, Gleason 4+3 prostate cancer that was diagnosed on 06/05/18 by Dr. Karsten Ro and has since been treated with ADT. The patient was previously being assessed for possible ALS due to profound peripheral neuropathy, but states that his prior neurologic issues were potentially due to DM2.   Last PSA: 0.21 (03/2020)  PSA at diagnosis: 7.29  Prostate volume: 48 cc  Pathology: Adenocarcinoma Gleason 4+3=7 in 1 core and 3+3=6 in 4 cores total of 5/12 cores positive.  Stage: T1c  Treatment: Eligard 09/22/19   03/21/20: The patient is here today for a routine follow-up. Walking up stairs and is able to stand under his own power, which he wasn't able to do one year ago. From a urinary standpoint, he reports an adequate FOS and feels like he is emptying his bladder well. He has occasional urgency/frequency, but is not bothered by it. Nocturia x 3--denies caffeine or alcohol intake. Denies interval UTIs, dysuria or hematuria.   05/23/2020: The patient is here today for a routine follow-up after surveillance prostate biopsy on 05/12/2020, which revealed Gleason 3+4 disease in 1 core at the left lateral mid gland (50% involvement). He has done well after his prostate biopsy and denies persistent hematuria or blood per rectum.   08/04/2020: Up today for office visit/preoperative appointment prior to undergoing radioactive seed implant with SpaceOAR on 5/27  with Dr Lovena Neighbours. Denies any changes to past medical history, prescription medications taken on a daily basis, no interval surgical procedures or infection treatment. He continues to void at his baseline with stable symptomatology. He continues tamsulosin. No interval burning or painful urination, visible blood in the urine, interval treatment for UTI. Denies any new or worsening cardiovascular symptoms including absence of chest pain, shortness of breath, palpitations or paresthesias. No interval fevers or chills, nausea/vomiting.     ALLERGIES: Jardiance metformin - Vomiting    MEDICATIONS: Hydrocodone-Acetaminophen 5 mg-325 mg tablet 1 tablet PO As Directed Take one hour prior to your scheduled procedure.  Simvastatin  Tamsulosin Hcl 0.4 mg capsule 1 capsule PO Daily  Tamsulosin Hcl 0.4 mg capsule 1 capsule PO Q 12 H  Advil  Claritin 10 mg capsule  Gabapentin  Meclizine Hcl  Mirtazapine  Novolog  Pregabalin 100 mg capsule  Riluzole 50 mg tablet  Trulicity     GU PSH: Prostate Needle Biopsy - 05/12/2020, 2020       PSH Notes: Rotator Cuff Repair, Tonsillectomy With Adenoidectomy   NON-GU PSH: Remove Tonsils And Adenoids - 2010 Surgical Pathology, Gross And Microscopic Examination For Prostate Needle - 05/12/2020, 2020     GU PMH: BPH w/LUTS - 05/23/2020, - 03/21/2020 Prostate Cancer - 05/23/2020, - 05/12/2020, - 03/21/2020, - 12/21/2019, - 06/15/2019 (Stable), - 12/15/2018 (Stable), He has elected to proceed with 6 month Lupron injection. He is going to begin vitamin-D and calcium. I will then have him return in 6 months for reassessment and will obtain PSA and testosterone at that time., - 2020, He has elected to proceed with radioactive  seed implant. He will be scheduled for an appointment with Dr. Tammi Klippel., - 2020 Urinary Urgency - 05/23/2020 Weak Urinary Stream - 03/21/2020 Nocturia - 12/21/2019 Urinary Frequency - 12/21/2019 Elevated PSA (Stable) - 2020, Although I found no abnormality on  DRE his PSA has increased pre significantly so we have elected to proceed with further evaluation with a prostate biopsy., - 2020, Elevated prostate specific antigen (PSA), - 2014 Renal cyst, Bilateral, Slight growth of Bosniak category 2 right renal cyst. Most recent MRI was not done with renal protocol, and there is a question of some enhancement - 2020 BPH w/o LUTS, Benign prostatic hypertrophy without lower urinary tract symptoms - 2014 Unil Inguinal Hernia W/O obst or gang,non-recurrent, Inguinal hernia, left - 2014      PMH Notes: Elevated PSA: An elevated PSA of 4.93 was first noted in 7/10. He has no obstructive or irritative voiding symptoms. There is no family history of prostate cancer. His PSA was followed and subsequently fell where it remained within the normal range.   Bilateral renal cyst: He underwent a CT scan in 8/13 which revealed an indeterminate cyst in the lower pole of the left kidney. Further evaluation was then undertaken with an MRI scan with and without contrast which revealed several cysts within the left kidney all that were simple. There was a septated cyst in the upper pole of the right kidney consistent with a Bosniak class II cyst.  The right and left renal cysts were again seen on MRI scan of the lumbar spine in 04/06/19.  Right renal ultrasound on 04/13/18 confirmed the cystic nature again.      NON-GU PMH: Flushing (Stable) - 12/15/2018 Other long term (current) drug therapy (Stable) - 12/15/2018 Pyuria/other UA findings, I noted some bacteriuria so I will culture his urine today. - 2020 Encounter for general adult medical examination without abnormal findings, Encounter for preventive health examination - 2015 Personal history of other diseases of the circulatory system, History of hypertension - 2014 Personal history of other endocrine, nutritional and metabolic disease, History of hypercholesterolemia - 2014 Amyotrophic lateral  sclerosis Hypercholesterolemia Hypertension    FAMILY HISTORY: Acute Myocardial Infarction - Mother Death In The Family Father - Runs In Family Death In The Family Mother - Runs In [facility] Family Health Status Number - Mother   SOCIAL HISTORY: Marital Status: Divorced Current Smoking Status: Patient has never smoked.   Tobacco Use Assessment Completed: Used Tobacco in last 30 days? Does drink.  Drinks 1 caffeinated drink per day. Patient's occupation is/was Retired.     Notes: Never A Smoker, Occupation:, Caffeine Use, Alcohol Use, Tobacco Use, Marital History - Single   REVIEW OF SYSTEMS:    GU Review Male:   Patient reports frequent urination and get up at night to urinate. Patient denies hard to postpone urination, burning/ pain with urination, leakage of urine, stream starts and stops, trouble starting your stream, have to strain to urinate , erection problems, and penile pain.  Gastrointestinal (Upper):   Patient denies nausea, vomiting, and indigestion/ heartburn.  Gastrointestinal (Lower):   Patient denies diarrhea and constipation.  Constitutional:   Patient reports night sweats. Patient denies fever, weight loss, and fatigue.  Skin:   Patient denies skin rash/ lesion and itching.  Eyes:   Patient denies blurred vision and double vision.  Ears/ Nose/ Throat:   Patient denies sore throat and sinus problems.  Hematologic/Lymphatic:   Patient denies swollen glands and easy bruising.  Cardiovascular:   Patient denies chest  pains and leg swelling.  Respiratory:   Patient denies cough and shortness of breath.  Endocrine:   Patient denies excessive thirst.  Musculoskeletal:   Patient denies back pain and joint pain.  Neurological:   Patient denies headaches and dizziness.  Psychologic:   Patient denies depression and anxiety.   Notes: Updated from previous visit 03/21/2020 with review from patient as noted above.   VITAL SIGNS:      08/04/2020 08:08 AM  Weight 220 lb / 99.79  kg  Height 72 in / 182.88 cm  BP 169/97 mmHg  Pulse 76 /min  Temperature 97.5 F / 36.3 C  BMI 29.8 kg/m   GU PHYSICAL EXAMINATION:    Seminal Vesicles: Nonpalpable.   MULTI-SYSTEM PHYSICAL EXAMINATION:    Constitutional: Well-nourished. No physical deformities. Normally developed. Good grooming. In power chair due to lower extremity weakness.   Neck: Neck symmetrical, not swollen. Normal tracheal position.  Respiratory: No labored breathing, no use of accessory muscles.   Cardiovascular: Normal temperature, normal extremity pulses, no swelling, no varicosities.  Skin: No paleness, no jaundice, no cyanosis. No lesion, no ulcer, no rash.  Neurologic / Psychiatric: Oriented to time, oriented to place, oriented to person. No depression, no anxiety, no agitation.  Gastrointestinal: No mass, no tenderness, no rigidity, non obese abdomen.  Musculoskeletal: Normal gait and station of head and neck.     Complexity of Data:  Source Of History:  Patient, Family/Caregiver, Medical Record Summary  Lab Test Review:   PSA  Records Review:   Pathology Reports, Previous Doctor Records, Previous Hospital Records, Previous Patient Records  Urine Test Review:   Urinalysis   03/15/20 09/17/19 06/09/19 04/06/19 12/08/18 04/24/18 08/31/11 05/31/10  PSA  Total PSA 0.21 ng/mL 5.03 ng/mL 2.47 ng/mL 2.00 ng/mL 0.16 ng/mL 7.29 ng/dl 3.39  3.71     03/15/20 06/09/19 04/06/19  Hormones  Testosterone, Total <10 ng/dL 618.8 ng/dL 605.0 ng/dL    08/04/20  Urinalysis  Urine Appearance Clear   Urine Color Yellow   Urine Glucose 3+ mg/dL  Urine Bilirubin Neg mg/dL  Urine Ketones Neg mg/dL  Urine Specific Gravity 1.020   Urine Blood Neg ery/uL  Urine pH 5.5   Urine Protein Neg mg/dL  Urine Urobilinogen 0.2 mg/dL  Urine Nitrites Neg   Urine Leukocyte Esterase Neg leu/uL   PROCEDURES:          Urinalysis Dipstick Dipstick Cont'd  Color: Yellow Bilirubin: Neg mg/dL  Appearance: Clear Ketones: Neg  mg/dL  Specific Gravity: 1.020 Blood: Neg ery/uL  pH: 5.5 Protein: Neg mg/dL  Glucose: 3+ mg/dL Urobilinogen: 0.2 mg/dL    Nitrites: Neg    Leukocyte Esterase: Neg leu/uL    ASSESSMENT:      ICD-10 Details  1 GU:   Prostate Cancer - C61 Chronic, Threat to Bodily Function  2   Nocturia - R35.1 Chronic, Stable   PLAN:           Orders Labs Urine Culture          Schedule Return Visit/Planned Activity: Keep Scheduled Appointment - Follow up MD, Schedule Surgery          Document Letter(s):  Created for Patient: Clinical Summary         Notes:  70 year old male with grade 3 prostate cancer  .-The patient was counseled about the natural history of prostate cancer and the standard treatment options that are available for prostate cancer. It was explained to him how his age and  life expectancy, clinical stage, Gleason score, and PSA affect his prognosis, the decision to proceed with additional staging studies, as well as how that information influences recommended treatment strategies. We discussed the roles for active surveillance, radiation therapy, surgical therapy, androgen deprivation, as well as ablative therapy options for the treatment of prostate cancer as appropriate to his individual cancer situation. We discussed the risks and benefits of these options with regard to their impact on cancer control and also in terms of potential adverse events, complications, and impact on quality of life particularly related to urinary and sexual function. The patient was encouraged to ask questions throughout the discussion today and all questions were answered to his stated satisfaction. In addition, the patient was provided with and/or directed to appropriate resources and literature for further education about prostate cancer and treatment options.   The patient has decided to proceed with cystoscopy, brachytherapy seed and SpaceOAR placement as primary treatment of his risk prostate cancer.   The risks, benefits and alternatives of the aforementioned procedures was discussed in detail.  Risks include, bur are not limited to worsening LUTS, erectile dysfunction, rectal irritation, urethral stricture formation, fistula formation, cancer recurrence, MI, CVA, PE, DVT and the inherent risk of general anesthesia.  He voices understanding and wishes to proceed.

## 2020-09-01 NOTE — Anesthesia Preprocedure Evaluation (Signed)
Anesthesia Evaluation  Patient identified by MRN, date of birth, ID band Patient awake    Reviewed: Allergy & Precautions, NPO status , Patient's Chart, lab work & pertinent test results  Airway Mallampati: II  TM Distance: >3 FB Neck ROM: Limited    Dental no notable dental hx.    Pulmonary neg pulmonary ROS,    breath sounds clear to auscultation + decreased breath sounds      Cardiovascular hypertension, Normal cardiovascular exam Rhythm:Regular Rate:Normal     Neuro/Psych Mr. Albert Andrews is a 70 y.o. male presented with diffuse progressive weakness due to a combination of diabetic neuropathy, bilateral ulnar neuropathies, and diabetic amyotrophy. There has been concern about PMA variant of ALS but his improvements make that less likely  Neuromuscular disease negative psych ROS   GI/Hepatic Neg liver ROS, GERD  ,  Endo/Other  diabetes  Renal/GU negative Renal ROS  negative genitourinary   Musculoskeletal Wheel chair bound   Abdominal   Peds negative pediatric ROS (+)  Hematology negative hematology ROS (+)   Anesthesia Other Findings   Reproductive/Obstetrics negative OB ROS                             Anesthesia Physical Anesthesia Plan  ASA: III  Anesthesia Plan: General   Post-op Pain Management:    Induction: Intravenous  PONV Risk Score and Plan: 2 and Ondansetron, Dexamethasone and Treatment may vary due to age or medical condition  Airway Management Planned: LMA and Oral ETT  Additional Equipment:   Intra-op Plan:   Post-operative Plan: Extubation in OR  Informed Consent: I have reviewed the patients History and Physical, chart, labs and discussed the procedure including the risks, benefits and alternatives for the proposed anesthesia with the patient or authorized representative who has indicated his/her understanding and acceptance.     Dental advisory  given  Plan Discussed with: CRNA and Surgeon  Anesthesia Plan Comments:         Anesthesia Quick Evaluation

## 2020-09-01 NOTE — Op Note (Signed)
PATIENT:  Albert Andrews  PRE-OPERATIVE DIAGNOSIS:  Adenocarcinoma of the prostate  POST-OPERATIVE DIAGNOSIS:  Same  PROCEDURE:  1. I-125 radioactive seed implantation 2. Cystoscopy  3. Placement of SpaceOAR  SURGEON:  Ellison Hughs, MD  Radiation oncologist: Tyler Pita, MD  ANESTHESIA:  General  EBL:  Minimal  DRAINS: None  INDICATION: Albert Andrews is a 70 year old male with grade 3 prostate cancer.  He has elected to proceed with brachytherapy seed placement as definitive treatment of his prostate cancer.  He has been consented for the above procedures, voices understanding and wishes to proceed.  Description of procedure: After informed consent the patient was brought to the major OR, placed on the table and administered general anesthesia. He was then moved to the modified lithotomy position with his perineum perpendicular to the floor. His perineum and genitalia were then sterilely prepped. An official timeout was then performed. A 16 French Foley catheter was then placed in the bladder and filled with dilute contrast, a rectal tube was placed in the rectum and the transrectal ultrasound probe was placed in the rectum and affixed to the stand. He was then sterilely draped.  Real time ultrasonography was used along with the seed planning software. This was used to develop the seed plan including the number of needles as well as number of seeds required for complete and adequate coverage. Real-time ultrasonography was then used along with the previously developed plan and the Nucletron device to implant a total of 73 seeds using 20 needles. This proceeded without difficulty or complication.   I then proceeded with placement of SpaceOAR by introducing a needle with the bevel angled inferiorly approximately 2 cm superior to the anus. This was angled downward and under direct ultrasound was placed within the space between the prostatic capsule and rectum. This was confirmed  with a small amount of sterile saline injected and this was performed under direct ultrasound. I then attached the SpaceOAR to the needle and injected this in the space between the prostate and rectum with good placement noted.  A Foley catheter was then removed as well as the transrectal ultrasound probe and rectal probe. Flexible cystoscopy was then performed using the 16 French flexible scope which revealed a normal urethra throughout its length down to the sphincter which appeared intact. The prostatic urethra revealed bilobar hypertrophy but no evidence of obstruction, seeds, spacers or lesions. The bladder was then entered and fully and systematically inspected. The ureteral orifices were noted to be of normal configuration and position. The mucosa revealed no evidence of tumors. There were also no stones identified within the bladder. I noted no seeds or spacers on the floor of the bladder and retroflexion of the scope revealed no seeds protruding from the base of the prostate.  The cystoscope was then removed and the patient was awakened and taken to recovery room in stable and satisfactory condition. He tolerated procedure well and there were no intraoperative complications.

## 2020-09-01 NOTE — Transfer of Care (Signed)
Immediate Anesthesia Transfer of Care Note  Patient: Albert Andrews  Procedure(s) Performed: Procedure(s) (LRB): RADIOACTIVE SEED IMPLANT/BRACHYTHERAPY IMPLANT (N/A) SPACE OAR INSTILLATION (N/A)  Patient Location: PACU  Anesthesia Type: General  Level of Consciousness: awake, oriented, sedated and patient cooperative  Airway & Oxygen Therapy: Patient Spontanous Breathing and Patient connected to face mask oxygen  Post-op Assessment: Report given to PACU RN and Post -op Vital signs reviewed and stable  Post vital signs: Reviewed and stable  Complications: No apparent anesthesia complications  Last Vitals:  Vitals Value Taken Time  BP 100/73 09/01/20 1634  Temp 37 C 09/01/20 1634  Pulse 78 09/01/20 1643  Resp 21 09/01/20 1643  SpO2 90 % 09/01/20 1643  Vitals shown include unvalidated device data.  Last Pain:  Vitals:   09/01/20 1148  TempSrc: Oral  PainSc: 0-No pain      Patients Stated Pain Goal: 5 (48/59/27 6394)  Complications: No complications documented.

## 2020-09-02 LAB — PSA, TOTAL AND FREE
PSA, Free Pct: 18 %
PSA, Free: 0.09 ng/mL
Prostate Specific Ag, Serum: 0.5 ng/mL (ref 0.0–4.0)

## 2020-09-05 DIAGNOSIS — R0989 Other specified symptoms and signs involving the circulatory and respiratory systems: Secondary | ICD-10-CM | POA: Diagnosis not present

## 2020-09-05 DIAGNOSIS — R059 Cough, unspecified: Secondary | ICD-10-CM | POA: Diagnosis not present

## 2020-09-05 NOTE — Anesthesia Postprocedure Evaluation (Signed)
Anesthesia Post Note  Patient: Albert Andrews  Procedure(s) Performed: RADIOACTIVE SEED IMPLANT/BRACHYTHERAPY IMPLANT (N/A Prostate) SPACE OAR INSTILLATION (N/A Rectum)     Patient location during evaluation: PACU Anesthesia Type: General Level of consciousness: awake and alert Pain management: pain level controlled Vital Signs Assessment: post-procedure vital signs reviewed and stable Respiratory status: spontaneous breathing, nonlabored ventilation, respiratory function stable and patient connected to nasal cannula oxygen Cardiovascular status: blood pressure returned to baseline and stable Postop Assessment: no apparent nausea or vomiting Anesthetic complications: no   No complications documented.  Last Vitals:  Vitals:   09/01/20 1730 09/01/20 1745  BP:  (!) 153/88  Pulse: 78 73  Resp: 20 18  Temp:  36.5 C  SpO2: 92% 93%    Last Pain:  Vitals:   09/01/20 1745  TempSrc:   PainSc: 0-No pain                 Sterlin Knightly S

## 2020-09-06 ENCOUNTER — Encounter (HOSPITAL_BASED_OUTPATIENT_CLINIC_OR_DEPARTMENT_OTHER): Payer: Self-pay | Admitting: Urology

## 2020-09-08 DIAGNOSIS — R0601 Orthopnea: Secondary | ICD-10-CM | POA: Diagnosis not present

## 2020-09-13 ENCOUNTER — Other Ambulatory Visit: Payer: Self-pay | Admitting: Physician Assistant

## 2020-09-13 ENCOUNTER — Ambulatory Visit
Admission: RE | Admit: 2020-09-13 | Discharge: 2020-09-13 | Disposition: A | Discharge status: Home / Self Care | Payer: HMO | Source: Ambulatory Visit | Attending: Physician Assistant | Admitting: Physician Assistant

## 2020-09-13 DIAGNOSIS — R059 Cough, unspecified: Secondary | ICD-10-CM

## 2020-09-14 DIAGNOSIS — M79672 Pain in left foot: Secondary | ICD-10-CM | POA: Diagnosis not present

## 2020-09-14 DIAGNOSIS — M79671 Pain in right foot: Secondary | ICD-10-CM | POA: Diagnosis not present

## 2020-09-20 ENCOUNTER — Telehealth: Payer: Self-pay | Admitting: *Deleted

## 2020-09-20 NOTE — Progress Notes (Signed)
Radiation Oncology         [phone] ________________________________  Name: SELMA RODELO MRN: [id number]  Date: 09/21/2020  DOB: 05-Jun-1950  Post-Seed Follow-Up Visit Note  CC: Wenda Low, MD  Davis Gourd*  Diagnosis:   70 y.o. gentleman with Stage T1c adenocarcinoma of the prostate with Gleason score of 3+4, and PSA of 0.21 on ADT.    ICD-10-CM   1. Malignant neoplasm of prostate (HCC)  C61       Interval Since Last Radiation:  2.5 weeks 09/01/20:  Insertion of radioactive I-125 seeds into the prostate gland; 145 Gy, definitive therapy with placement of SpaceOAR Vue gel.  Narrative:  The patient returns today for routine follow-up.  He is complaining of increased urinary frequency and urinary hesitation symptoms. He filled out a questionnaire regarding urinary function today providing and overall IPSS score of 23 characterizing his symptoms as severe with urgency, frequency, weak stream, hesitancy, intermittency and nocturia x3. He has occasional mild dysuria at the start of his stream but this is gradually improving. He specifically denies gross hematuria, fever, chills or night sweats.  His pre-implant score was 7. He has continued taking Flomax daily. He denies any abdominal pain or bowel symptoms. He reports a healthy appetite but has noticed some change in his taste buds since the procedure. He is maintaining his weight and has not noted significant fatigue. Overall, he is pleased with his progress to date.  ALLERGIES:  is allergic to jardiance [empagliflozin] and metformin and related.  Meds: Current Outpatient Medications  Medication Sig Dispense Refill   Blood Glucose Monitoring Suppl (FREESTYLE FREEDOM LITE) w/Device KIT USE TO CHECK GLUCOSE ONCE DAILY     Dulaglutide (TRULICITY) 8.58 IF/0.2DX SOPN Inject 1.5 mg into the skin. Every friday     Insulin Degludec (TRESIBA) 100 UNIT/ML SOLN Inject into the skin 2 (two) times daily. 75 units in am 55 units at  hs     meclizine (ANTIVERT) 25 MG tablet Take 25 mg by mouth See admin instructions. Take 25 mg at night, may take a 25 mg dose during the day as needed for dizziness     mirtazapine (REMERON) 15 MG tablet Take 15 mg by mouth at bedtime.     NOVOLOG FLEXPEN 100 UNIT/ML FlexPen Inject into the skin. Prior to each meal 8 to 16 units     omeprazole (PRILOSEC) 40 MG capsule Take 40 mg by mouth daily.     OVER THE COUNTER MEDICATION Take 1 tablet by mouth every 14 (fourteen) days. Balance of nature otc supplement     pregabalin (LYRICA) 100 MG capsule Take by mouth 2 (two) times daily.     simvastatin (ZOCOR) 10 MG tablet Take 10 mg by mouth every evening.      tamsulosin (FLOMAX) 0.4 MG CAPS capsule Take 0.4 mg by mouth 2 (two) times daily.     UNKNOWN TO PATIENT Reflux pill bid     No current facility-administered medications for this visit.    Physical Findings: In general this is a well appearing Caucasian male in no acute distress. He's alert and oriented x4 and appropriate throughout the examination. Cardiopulmonary assessment is negative for acute distress and he exhibits normal effort.   Lab Findings: Lab Results  Component Value Date   WBC 7.8 08/29/2020   HGB 14.9 08/29/2020   HCT 43.2 08/29/2020   MCV 91.5 08/29/2020   PLT 122 (L) 08/29/2020    Radiographic Findings:  Patient underwent CT imaging  in our clinic for post implant dosimetry. The CT will be reviewed by Dr. Tammi Klippel to confirm there is an adequate distribution of radioactive seeds throughout the prostate gland and ensure that there are no seeds in or near the rectum. We suspect the final radiation plan and dosimetry will show appropriate coverage of the prostate gland. He understands that we will call and inform him of any unexpected findings on further review of his imaging and dosimetry.  Impression/Plan: 70 y.o. gentleman with Stage T1c adenocarcinoma of the prostate with Gleason score of 3+4, and PSA of 0.21 on  ADT. The patient is recovering from the effects of radiation. His urinary symptoms should gradually improve over the next 4-6 months. We talked about this today. He is encouraged by his improvement already and is otherwise pleased with his outcome. We also talked about long-term follow-up for prostate cancer following seed implant. He understands that ongoing PSA determinations and digital rectal exams will help perform surveillance to rule out disease recurrence. He has a follow up appointment scheduled with Jiles Crocker, NP on 09/29/20 and will follow up with Dr. Lovena Neighbours in early September 2022. He understands what to expect with his PSA measures. Patient was also educated today about some of the long-term effects from radiation including a small risk for rectal bleeding and possibly erectile dysfunction. We talked about some of the general management approaches to these potential complications. However, I did encourage the patient to contact our office or return at any point if he has questions or concerns related to his previous radiation and prostate cancer.    Nicholos Johns, PA-C

## 2020-09-20 NOTE — Telephone Encounter (Signed)
CALLED PATIENT TO REMIND OF POST SEED APPTS. FOR 09-21-20, SPOKE WITH PATIENT AND HE IS AWARE OF THESE APPTS.

## 2020-09-20 NOTE — Progress Notes (Signed)
  Radiation Oncology         [phone] ________________________________  Name: Albert Andrews MRN: [id number]  Date: 09/21/2020  DOB: 08/01/1950  COMPLEX SIMULATION NOTE  NARRATIVE:  The patient was brought to the [facility] today following prostate seed implantation approximately one month ago.  Identity was confirmed.  All relevant records and images related to the planned course of therapy were reviewed.  Then, the patient was set-up supine.  CT images were obtained.  The CT images were loaded into the planning software.  Then the prostate and rectum were contoured.  Treatment planning then occurred.  The implanted iodine 125 seeds were identified by the physics staff for projection of radiation distribution  I have requested : 3D Simulation  I have requested a DVH of the following structures: Prostate and rectum.    ________________________________  Sheral Apley Tammi Klippel, M.D.

## 2020-09-21 ENCOUNTER — Ambulatory Visit
Admission: RE | Admit: 2020-09-21 | Discharge: 2020-09-21 | Disposition: A | Discharge status: Home / Self Care | Payer: HMO | Source: Ambulatory Visit | Attending: Radiation Oncology | Admitting: Radiation Oncology

## 2020-09-21 ENCOUNTER — Other Ambulatory Visit: Payer: Self-pay

## 2020-09-21 ENCOUNTER — Encounter: Payer: Self-pay | Admitting: Urology

## 2020-09-21 ENCOUNTER — Ambulatory Visit
Admission: RE | Admit: 2020-09-21 | Discharge: 2020-09-21 | Disposition: A | Discharge status: Home / Self Care | Payer: HMO | Source: Ambulatory Visit | Attending: Urology | Admitting: Urology

## 2020-09-21 VITALS — BP 152/91 | HR 74 | Temp 97.8°F | Resp 20

## 2020-09-21 DIAGNOSIS — C61 Malignant neoplasm of prostate: Secondary | ICD-10-CM | POA: Insufficient documentation

## 2020-09-21 NOTE — Progress Notes (Signed)
BP (!) 152/91 (BP Location: Left Arm, Patient Position: Sitting, Cuff Size: Normal)   Pulse 74   Temp 97.8 F (36.6 C)   Resp 20   SpO2 96%   Patient states he is doing okay since seed placement. Feels like he has to urinate often and it burns, a lot of times it is a small amount. Aua score is 23. Wife states his taste buds are off. Denies any pain at this time. Continues with Tamsulosin one time per day.

## 2020-09-28 DIAGNOSIS — K219 Gastro-esophageal reflux disease without esophagitis: Secondary | ICD-10-CM | POA: Diagnosis not present

## 2020-09-28 DIAGNOSIS — E1165 Type 2 diabetes mellitus with hyperglycemia: Secondary | ICD-10-CM | POA: Diagnosis not present

## 2020-09-28 DIAGNOSIS — E785 Hyperlipidemia, unspecified: Secondary | ICD-10-CM | POA: Diagnosis not present

## 2020-09-28 DIAGNOSIS — C61 Malignant neoplasm of prostate: Secondary | ICD-10-CM | POA: Diagnosis not present

## 2020-09-28 DIAGNOSIS — E1169 Type 2 diabetes mellitus with other specified complication: Secondary | ICD-10-CM | POA: Diagnosis not present

## 2020-09-28 DIAGNOSIS — Z8546 Personal history of malignant neoplasm of prostate: Secondary | ICD-10-CM | POA: Diagnosis not present

## 2020-09-28 DIAGNOSIS — E1140 Type 2 diabetes mellitus with diabetic neuropathy, unspecified: Secondary | ICD-10-CM | POA: Diagnosis not present

## 2020-09-29 DIAGNOSIS — C61 Malignant neoplasm of prostate: Secondary | ICD-10-CM | POA: Diagnosis not present

## 2020-09-29 DIAGNOSIS — G1221 Amyotrophic lateral sclerosis: Secondary | ICD-10-CM | POA: Diagnosis not present

## 2020-10-14 DIAGNOSIS — M79671 Pain in right foot: Secondary | ICD-10-CM | POA: Diagnosis not present

## 2020-10-14 DIAGNOSIS — M79672 Pain in left foot: Secondary | ICD-10-CM | POA: Diagnosis not present

## 2020-10-26 DIAGNOSIS — E785 Hyperlipidemia, unspecified: Secondary | ICD-10-CM | POA: Diagnosis not present

## 2020-10-26 DIAGNOSIS — K219 Gastro-esophageal reflux disease without esophagitis: Secondary | ICD-10-CM | POA: Diagnosis not present

## 2020-10-26 DIAGNOSIS — E1140 Type 2 diabetes mellitus with diabetic neuropathy, unspecified: Secondary | ICD-10-CM | POA: Diagnosis not present

## 2020-10-26 DIAGNOSIS — Z8546 Personal history of malignant neoplasm of prostate: Secondary | ICD-10-CM | POA: Diagnosis not present

## 2020-10-26 DIAGNOSIS — C61 Malignant neoplasm of prostate: Secondary | ICD-10-CM | POA: Diagnosis not present

## 2020-10-26 DIAGNOSIS — E1165 Type 2 diabetes mellitus with hyperglycemia: Secondary | ICD-10-CM | POA: Diagnosis not present

## 2020-10-26 DIAGNOSIS — E1169 Type 2 diabetes mellitus with other specified complication: Secondary | ICD-10-CM | POA: Diagnosis not present

## 2020-10-30 DIAGNOSIS — R059 Cough, unspecified: Secondary | ICD-10-CM | POA: Diagnosis not present

## 2020-10-30 DIAGNOSIS — K219 Gastro-esophageal reflux disease without esophagitis: Secondary | ICD-10-CM | POA: Diagnosis not present

## 2020-11-01 DIAGNOSIS — L304 Erythema intertrigo: Secondary | ICD-10-CM | POA: Diagnosis not present

## 2020-11-01 DIAGNOSIS — D485 Neoplasm of uncertain behavior of skin: Secondary | ICD-10-CM | POA: Diagnosis not present

## 2020-11-01 DIAGNOSIS — L814 Other melanin hyperpigmentation: Secondary | ICD-10-CM | POA: Diagnosis not present

## 2020-11-01 DIAGNOSIS — L570 Actinic keratosis: Secondary | ICD-10-CM | POA: Diagnosis not present

## 2020-11-01 DIAGNOSIS — C44719 Basal cell carcinoma of skin of left lower limb, including hip: Secondary | ICD-10-CM | POA: Diagnosis not present

## 2020-11-01 DIAGNOSIS — Z85820 Personal history of malignant melanoma of skin: Secondary | ICD-10-CM | POA: Diagnosis not present

## 2020-11-01 DIAGNOSIS — Z85828 Personal history of other malignant neoplasm of skin: Secondary | ICD-10-CM | POA: Diagnosis not present

## 2020-11-01 DIAGNOSIS — L821 Other seborrheic keratosis: Secondary | ICD-10-CM | POA: Diagnosis not present

## 2020-11-14 DIAGNOSIS — M79672 Pain in left foot: Secondary | ICD-10-CM | POA: Diagnosis not present

## 2020-11-14 DIAGNOSIS — M79671 Pain in right foot: Secondary | ICD-10-CM | POA: Diagnosis not present

## 2020-11-17 ENCOUNTER — Encounter: Payer: Self-pay | Admitting: Radiation Oncology

## 2020-11-17 ENCOUNTER — Ambulatory Visit
Admission: RE | Admit: 2020-11-17 | Discharge: 2020-11-17 | Disposition: A | Discharge status: Home / Self Care | Payer: HMO | Source: Ambulatory Visit | Attending: Radiation Oncology | Admitting: Radiation Oncology

## 2020-11-17 DIAGNOSIS — C61 Malignant neoplasm of prostate: Secondary | ICD-10-CM | POA: Insufficient documentation

## 2020-11-27 NOTE — Progress Notes (Signed)
  Radiation Oncology         [phone] ________________________________  Name: Albert Andrews MRN: [id number]  Date: 11/17/2020  DOB: 1950-10-07  3D Planning Note   Prostate Brachytherapy Post-Implant Dosimetry  Diagnosis: 70 y.o. gentleman with Stage T1c adenocarcinoma of the prostate with Gleason score of 3+4, and PSA of 0.21 on ADT.  Narrative: On a previous date, Albert Andrews returned following prostate seed implantation for post implant planning. He underwent CT scan complex simulation to delineate the three-dimensional structures of the pelvis and demonstrate the radiation distribution.  Since that time, the seed localization, and complex isodose planning with dose volume histograms have now been completed.  Results:   Prostate Coverage - The dose of radiation delivered to the 90% or more of the prostate gland (D90) was 111.14% of the prescription dose. This exceeds our goal of greater than 90%. Rectal Sparing - The volume of rectal tissue receiving the prescription dose or higher was 0.0 cc. This falls under our thresholds tolerance of 1.0 cc.  Impression: The prostate seed implant appears to show adequate target coverage and appropriate rectal sparing.  Plan:  The patient will continue to follow with urology for ongoing PSA determinations. I would anticipate a high likelihood for local tumor control with minimal risk for rectal morbidity.  ________________________________  Sheral Apley Tammi Klippel, M.D.

## 2020-11-28 DIAGNOSIS — E1140 Type 2 diabetes mellitus with diabetic neuropathy, unspecified: Secondary | ICD-10-CM | POA: Diagnosis not present

## 2020-11-28 DIAGNOSIS — C61 Malignant neoplasm of prostate: Secondary | ICD-10-CM | POA: Diagnosis not present

## 2020-11-28 DIAGNOSIS — E785 Hyperlipidemia, unspecified: Secondary | ICD-10-CM | POA: Diagnosis not present

## 2020-11-28 DIAGNOSIS — Z8546 Personal history of malignant neoplasm of prostate: Secondary | ICD-10-CM | POA: Diagnosis not present

## 2020-11-28 DIAGNOSIS — K219 Gastro-esophageal reflux disease without esophagitis: Secondary | ICD-10-CM | POA: Diagnosis not present

## 2020-11-28 DIAGNOSIS — E1169 Type 2 diabetes mellitus with other specified complication: Secondary | ICD-10-CM | POA: Diagnosis not present

## 2020-11-28 DIAGNOSIS — E1165 Type 2 diabetes mellitus with hyperglycemia: Secondary | ICD-10-CM | POA: Diagnosis not present

## 2020-11-30 DIAGNOSIS — C61 Malignant neoplasm of prostate: Secondary | ICD-10-CM | POA: Diagnosis not present

## 2020-12-04 DIAGNOSIS — E1165 Type 2 diabetes mellitus with hyperglycemia: Secondary | ICD-10-CM | POA: Diagnosis not present

## 2020-12-07 DIAGNOSIS — R3915 Urgency of urination: Secondary | ICD-10-CM | POA: Diagnosis not present

## 2020-12-07 DIAGNOSIS — N401 Enlarged prostate with lower urinary tract symptoms: Secondary | ICD-10-CM | POA: Diagnosis not present

## 2020-12-07 DIAGNOSIS — C61 Malignant neoplasm of prostate: Secondary | ICD-10-CM | POA: Diagnosis not present

## 2020-12-15 DIAGNOSIS — M79671 Pain in right foot: Secondary | ICD-10-CM | POA: Diagnosis not present

## 2020-12-15 DIAGNOSIS — M79672 Pain in left foot: Secondary | ICD-10-CM | POA: Diagnosis not present

## 2020-12-26 DIAGNOSIS — E1140 Type 2 diabetes mellitus with diabetic neuropathy, unspecified: Secondary | ICD-10-CM | POA: Diagnosis not present

## 2020-12-26 DIAGNOSIS — M6281 Muscle weakness (generalized): Secondary | ICD-10-CM | POA: Diagnosis not present

## 2020-12-26 DIAGNOSIS — C61 Malignant neoplasm of prostate: Secondary | ICD-10-CM | POA: Diagnosis not present

## 2020-12-26 DIAGNOSIS — E1165 Type 2 diabetes mellitus with hyperglycemia: Secondary | ICD-10-CM | POA: Diagnosis not present

## 2020-12-26 DIAGNOSIS — E785 Hyperlipidemia, unspecified: Secondary | ICD-10-CM | POA: Diagnosis not present

## 2020-12-26 DIAGNOSIS — Z0000 Encounter for general adult medical examination without abnormal findings: Secondary | ICD-10-CM | POA: Diagnosis not present

## 2020-12-26 DIAGNOSIS — I10 Essential (primary) hypertension: Secondary | ICD-10-CM | POA: Diagnosis not present

## 2020-12-26 DIAGNOSIS — Z23 Encounter for immunization: Secondary | ICD-10-CM | POA: Diagnosis not present

## 2020-12-26 DIAGNOSIS — Z1389 Encounter for screening for other disorder: Secondary | ICD-10-CM | POA: Diagnosis not present

## 2020-12-27 DIAGNOSIS — Z1211 Encounter for screening for malignant neoplasm of colon: Secondary | ICD-10-CM | POA: Diagnosis not present

## 2021-01-03 DIAGNOSIS — E119 Type 2 diabetes mellitus without complications: Secondary | ICD-10-CM | POA: Diagnosis not present

## 2021-01-03 DIAGNOSIS — R195 Other fecal abnormalities: Secondary | ICD-10-CM | POA: Diagnosis not present

## 2021-01-03 DIAGNOSIS — R059 Cough, unspecified: Secondary | ICD-10-CM | POA: Diagnosis not present

## 2021-01-04 ENCOUNTER — Other Ambulatory Visit: Payer: Self-pay | Admitting: Gastroenterology

## 2021-01-14 DIAGNOSIS — M79671 Pain in right foot: Secondary | ICD-10-CM | POA: Diagnosis not present

## 2021-01-14 DIAGNOSIS — M79672 Pain in left foot: Secondary | ICD-10-CM | POA: Diagnosis not present

## 2021-01-17 ENCOUNTER — Encounter (HOSPITAL_COMMUNITY): Payer: Self-pay | Admitting: Gastroenterology

## 2021-01-23 DIAGNOSIS — D696 Thrombocytopenia, unspecified: Secondary | ICD-10-CM | POA: Diagnosis not present

## 2021-01-26 ENCOUNTER — Ambulatory Visit (HOSPITAL_COMMUNITY): Payer: HMO | Admitting: Anesthesiology

## 2021-01-26 ENCOUNTER — Encounter (HOSPITAL_COMMUNITY)
Admission: RE | Disposition: A | Discharge status: Home / Self Care | Payer: Self-pay | Source: Home / Self Care | Attending: Gastroenterology

## 2021-01-26 ENCOUNTER — Encounter (HOSPITAL_COMMUNITY): Payer: Self-pay | Admitting: Gastroenterology

## 2021-01-26 ENCOUNTER — Other Ambulatory Visit: Payer: Self-pay

## 2021-01-26 ENCOUNTER — Ambulatory Visit (HOSPITAL_COMMUNITY)
Admission: RE | Admit: 2021-01-26 | Discharge: 2021-01-26 | Disposition: A | Discharge status: Home / Self Care | Payer: HMO | Attending: Gastroenterology | Admitting: Gastroenterology

## 2021-01-26 DIAGNOSIS — D122 Benign neoplasm of ascending colon: Secondary | ICD-10-CM | POA: Insufficient documentation

## 2021-01-26 DIAGNOSIS — Z888 Allergy status to other drugs, medicaments and biological substances status: Secondary | ICD-10-CM | POA: Insufficient documentation

## 2021-01-26 DIAGNOSIS — Z85820 Personal history of malignant melanoma of skin: Secondary | ICD-10-CM | POA: Diagnosis not present

## 2021-01-26 DIAGNOSIS — E119 Type 2 diabetes mellitus without complications: Secondary | ICD-10-CM | POA: Diagnosis not present

## 2021-01-26 DIAGNOSIS — K5730 Diverticulosis of large intestine without perforation or abscess without bleeding: Secondary | ICD-10-CM | POA: Insufficient documentation

## 2021-01-26 DIAGNOSIS — Z8546 Personal history of malignant neoplasm of prostate: Secondary | ICD-10-CM | POA: Insufficient documentation

## 2021-01-26 DIAGNOSIS — R195 Other fecal abnormalities: Secondary | ICD-10-CM | POA: Diagnosis not present

## 2021-01-26 DIAGNOSIS — I10 Essential (primary) hypertension: Secondary | ICD-10-CM | POA: Diagnosis not present

## 2021-01-26 DIAGNOSIS — E785 Hyperlipidemia, unspecified: Secondary | ICD-10-CM | POA: Diagnosis not present

## 2021-01-26 DIAGNOSIS — D123 Benign neoplasm of transverse colon: Secondary | ICD-10-CM | POA: Diagnosis not present

## 2021-01-26 DIAGNOSIS — K635 Polyp of colon: Secondary | ICD-10-CM | POA: Diagnosis not present

## 2021-01-26 DIAGNOSIS — D124 Benign neoplasm of descending colon: Secondary | ICD-10-CM | POA: Insufficient documentation

## 2021-01-26 DIAGNOSIS — D120 Benign neoplasm of cecum: Secondary | ICD-10-CM | POA: Insufficient documentation

## 2021-01-26 HISTORY — PX: COLONOSCOPY WITH PROPOFOL: SHX5780

## 2021-01-26 HISTORY — PX: POLYPECTOMY: SHX5525

## 2021-01-26 LAB — GLUCOSE, CAPILLARY: Glucose-Capillary: 160 mg/dL — ABNORMAL HIGH (ref 70–99)

## 2021-01-26 SURGERY — COLONOSCOPY WITH PROPOFOL
Anesthesia: Monitor Anesthesia Care

## 2021-01-26 MED ORDER — SODIUM CHLORIDE 0.9 % IV SOLN: INTRAVENOUS | Status: DC

## 2021-01-26 MED ORDER — LIDOCAINE HCL (CARDIAC) PF 100 MG/5ML IV SOSY
PREFILLED_SYRINGE | INTRAVENOUS | Status: DC | PRN
  Administered 2021-01-26: 60 mg via INTRAVENOUS

## 2021-01-26 MED ORDER — PROPOFOL 10 MG/ML IV BOLUS
INTRAVENOUS | Status: DC | PRN
  Administered 2021-01-26: 20 mg via INTRAVENOUS

## 2021-01-26 MED ORDER — PROPOFOL 500 MG/50ML IV EMUL
INTRAVENOUS | Status: DC | PRN
  Administered 2021-01-26: 125 ug/kg/min via INTRAVENOUS

## 2021-01-26 SURGICAL SUPPLY — 22 items

## 2021-01-26 NOTE — Transfer of Care (Signed)
Immediate Anesthesia Transfer of Care Note  Patient: Albert Andrews  Procedure(s) Performed: COLONOSCOPY WITH PROPOFOL POLYPECTOMY  Patient Location: PACU  Anesthesia Type:MAC  Level of Consciousness: drowsy  Airway & Oxygen Therapy: Patient Spontanous Breathing and Patient connected to nasal cannula oxygen  Post-op Assessment: Report given to RN and Post -op Vital signs reviewed and stable  Post vital signs: Reviewed and stable  Last Vitals:  Vitals Value Taken Time  BP 116/56 01/26/21 0910  Temp    Pulse 64 01/26/21 0912  Resp 12 01/26/21 0912  SpO2 97 % 01/26/21 0912  Vitals shown include unvalidated device data.  Last Pain:  Vitals:   01/26/21 0745  TempSrc: Oral  PainSc: 0-No pain         Complications: No notable events documented.

## 2021-01-26 NOTE — H&P (Signed)
Albert Andrews HPI: With routine testing the patient was found to have heme positive stool.  The patient denies any issues with hematochezia or melena.  Past Medical History:  Diagnosis Date   DM type 2 (diabetes mellitus, type 2) ([place])    GERD (gastroesophageal reflux disease)    Hyperlipidemia    Melanoma ([place]) 08/02/2020   removed from both legs areas healed    Prostate cancer ([place])    Urinary frequency    Weakness of both lower extremities    due to dm uses power wc    Past Surgical History:  Procedure Laterality Date   ADENOIDECTOMY, TONSILLECTOMY AND MYRINGOTOMY WITH TUBE PLACEMENT  as child   BIOPSY  01/07/2020   Procedure: BIOPSY;  Surgeon: Carol Ada, MD;  Location: [facility];  Service: Endoscopy;;   ESOPHAGOGASTRODUODENOSCOPY (EGD) WITH PROPOFOL N/A 01/07/2020   Procedure: ESOPHAGOGASTRODUODENOSCOPY (EGD) WITH PROPOFOL;  Surgeon: Carol Ada, MD;  Location: [facility];  Service: Endoscopy;  Laterality: N/A;   PROSTATE BIOPSY     RADIOACTIVE SEED IMPLANT N/A 09/01/2020   Procedure: RADIOACTIVE SEED IMPLANT/BRACHYTHERAPY IMPLANT;  Surgeon: Ceasar Mons, MD;  [facility];  Service: Urology;  Laterality: N/A;   ROTATOR CUFF REPAIR Left 1994   SPACE OAR INSTILLATION N/A 09/01/2020   Procedure: SPACE OAR INSTILLATION;  Surgeon: Ceasar Mons, MD;  Location[facility];  Service: Urology;  Laterality: N/A;    Family History  Problem Relation Age of Onset   Cancer Brother        unknown   Prostate cancer Neg Hx     Social History:  reports that he has never smoked. He has never used smokeless tobacco. He reports that he does not currently use alcohol. He reports that he does not use drugs.  Allergies:  Allergies  Allergen Reactions   Jardiance [Empagliflozin] Diarrhea   Metformin And Related Diarrhea    Medications: Scheduled: Continuous:  sodium chloride      No results found for this or  any previous visit (from the past 24 hour(s)).   No results found.  ROS:  As stated above in the HPI otherwise negative.  Blood pressure (!) 170/83, pulse 73, temperature 97.7 F (36.5 C), temperature source Oral, resp. rate 20, height 6' (1.829 m), weight 108.9 kg, SpO2 94 %.    PE: Gen: NAD, Alert and Oriented HEENT:  [place]/AT, EOMI Neck: Supple, no LAD Lungs: CTA Bilaterally CV: RRR without M/G/R ABD: Soft, NTND, +BS Ext: No C/C/E  Assessment/Plan: 1) Heme positive stool - colonoscopy.  Brynlei Klausner D 01/26/2021, 8:24 AM

## 2021-01-26 NOTE — Op Note (Signed)
[facility] Patient Name: Albert Andrews Procedure Date: 01/26/2021 MRN: [id number] Attending MD: Carol Ada , MD Date of Birth: 01/23/1951 CSN: [id number] Age: 70 Admit Type: Outpatient Procedure:                Colonoscopy Indications:              Heme positive stool Providers:                Carol Ada, MD, Particia Nearing, RN, Frazier Richards,                            Technician, Cletis Athens, Technician Referring MD:              Medicines:                Propofol per Anesthesia Complications:            No immediate complications. Estimated Blood Loss:     Estimated blood loss: none. Procedure:                Pre-Anesthesia Assessment:                           - Prior to the procedure, a History and Physical                            was performed, and patient medications and                            allergies were reviewed. The patient's tolerance of                            previous anesthesia was also reviewed. The risks                            and benefits of the procedure and the sedation                            options and risks were discussed with the patient.                            All questions were answered, and informed consent                            was obtained. Prior Anticoagulants: The patient has                            taken no previous anticoagulant or antiplatelet                            agents. ASA Grade Assessment: III - A patient with                            severe systemic disease. After reviewing the risks  and benefits, the patient was deemed in                            satisfactory condition to undergo the procedure.                           - Prior to the procedure, a History and Physical                            was performed, and patient medications and                            allergies were reviewed. The patient's tolerance of                            previous  anesthesia was also reviewed. The risks                            and benefits of the procedure and the sedation                            options and risks were discussed with the patient.                            All questions were answered, and informed consent                            was obtained. Prior Anticoagulants: The patient has                            taken no previous anticoagulant or antiplatelet                            agents. ASA Grade Assessment: III - A patient with                            severe systemic disease. After reviewing the risks                            and benefits, the patient was deemed in                            satisfactory condition to undergo the procedure.                           - Sedation was administered by an anesthesia                            professional. Deep sedation was attained.                           After obtaining informed consent, the colonoscope  was passed under direct vision. Throughout the                            procedure, the patient's blood pressure, pulse, and                            oxygen saturations were monitored continuously. The                            CF-HQ190L ([id number]) Olympus colonoscope was                            introduced through the anus and advanced to the the                            cecum, identified by appendiceal orifice and                            ileocecal valve. The colonoscopy was performed                            without difficulty. The patient tolerated the                            procedure well. The quality of the bowel                            preparation was good. The ileocecal valve,                            appendiceal orifice, and rectum were photographed. Scope In: 8:42:36 AM Scope Out: 9:04:05 AM Scope Withdrawal Time: 0 hours 16 minutes 13 seconds  Total Procedure Duration: 0 hours 21 minutes 29 seconds  Findings:       Eight sessile polyps were found in the descending colon, transverse       colon, ascending colon and cecum. The polyps were 2 to 5 mm in size.       These polyps were removed with a cold snare. Resection and retrieval       were complete.      Scattered small and large-mouthed diverticula were found in the sigmoid       colon.      It was not clear if a true polyp was present at the ICV or if it was       normal small bowel. It is favored to be normal small bowel, however,       cold biopsies were obtained. Impression:               - Eight 2 to 5 mm polyps in the descending colon,                            in the transverse colon, in the ascending colon and                            in the cecum, removed with a cold snare.  Resected                            and retrieved.                           - Diverticulosis in the sigmoid colon. Moderate Sedation:      Not Applicable - Patient had care per Anesthesia. Recommendation:           - Patient has a contact number available for                            emergencies. The signs and symptoms of potential                            delayed complications were discussed with the                            patient. Return to normal activities tomorrow.                            Written discharge instructions were provided to the                            patient.                           - Resume previous diet.                           - Continue present medications.                           - Await pathology results.                           - Repeat colonoscopy in 3 years for surveillance. Procedure Code(s):        --- Professional ---                           [phone], Colonoscopy, flexible; with removal of                            tumor(s), polyp(s), or other lesion(s) by snare                            technique Diagnosis Code(s):        --- Professional ---                           K63.5, Polyp of colon                            R19.5, Other fecal abnormalities                           K57.30, Diverticulosis of large intestine without  perforation or abscess without bleeding CPT copyright 2019 [employer]. All rights reserved. The codes documented in this report are preliminary and upon coder review may  be revised to meet current compliance requirements. Carol Ada, MD Carol Ada, MD 01/26/2021 9:16:03 AM This report has been signed electronically. Number of Addenda: 0

## 2021-01-26 NOTE — Anesthesia Preprocedure Evaluation (Addendum)
Anesthesia Evaluation  Patient identified by MRN, date of birth, ID band Patient awake    Reviewed: Allergy & Precautions, NPO status , Patient's Chart, lab work & pertinent test results  Airway Mallampati: III  TM Distance: >3 FB Neck ROM: Full    Dental  (+) Teeth Intact, Dental Advisory Given, Caps   Pulmonary neg pulmonary ROS,    Pulmonary exam normal breath sounds clear to auscultation       Cardiovascular hypertension, Pt. on medications Normal cardiovascular exam Rhythm:Regular Rate:Normal     Neuro/Psych  Neuromuscular disease    GI/Hepatic Neg liver ROS, GERD  Medicated,guaiac positive stool   Endo/Other  diabetes, Type 2, Oral Hypoglycemic Agents, Insulin DependentObesity   Renal/GU negative Renal ROS Bladder dysfunction  Prostate cancer     Musculoskeletal negative musculoskeletal ROS (+)   Abdominal   Peds  Hematology negative hematology ROS (+)   Anesthesia Other Findings Day of surgery medications reviewed with the patient.  Reproductive/Obstetrics                            Anesthesia Physical Anesthesia Plan  ASA: 3  Anesthesia Plan: MAC   Post-op Pain Management:    Induction: Intravenous  PONV Risk Score and Plan: 1 and Treatment may vary due to age or medical condition and Propofol infusion  Airway Management Planned: Natural Airway  Additional Equipment:   Intra-op Plan:   Post-operative Plan:   Informed Consent: I have reviewed the patients History and Physical, chart, labs and discussed the procedure including the risks, benefits and alternatives for the proposed anesthesia with the patient or authorized representative who has indicated his/her understanding and acceptance.     Dental advisory given  Plan Discussed with: CRNA  Anesthesia Plan Comments:        Anesthesia Quick Evaluation

## 2021-01-26 NOTE — Anesthesia Postprocedure Evaluation (Signed)
Anesthesia Post Note  Patient: Albert Andrews  Procedure(s) Performed: COLONOSCOPY WITH PROPOFOL POLYPECTOMY     Patient location during evaluation: Endoscopy Anesthesia Type: MAC Level of consciousness: awake and alert Pain management: pain level controlled Vital Signs Assessment: post-procedure vital signs reviewed and stable Respiratory status: spontaneous breathing, nonlabored ventilation and respiratory function stable Cardiovascular status: stable and blood pressure returned to baseline Postop Assessment: no apparent nausea or vomiting Anesthetic complications: no   No notable events documented.  Last Vitals:  Vitals:   01/26/21 0924 01/26/21 0936  BP: (!) 144/69 (!) 161/80  Pulse: 66 68  Resp: 17 (!) 24  Temp:    SpO2: 93% 93%    Last Pain:  Vitals:   01/26/21 0936  TempSrc:   PainSc: 0-No pain                 [name]

## 2021-01-29 LAB — SURGICAL PATHOLOGY

## 2021-01-30 ENCOUNTER — Telehealth: Payer: Self-pay | Admitting: Hematology

## 2021-01-30 NOTE — Telephone Encounter (Signed)
Scheduled appointment per 10/24 referral. Patient is aware. 

## 2021-02-07 ENCOUNTER — Inpatient Hospital Stay: Payer: HMO

## 2021-02-07 ENCOUNTER — Inpatient Hospital Stay: Payer: HMO | Attending: Hematology | Admitting: Hematology

## 2021-02-07 VITALS — BP 170/82 | HR 72 | Temp 97.9°F | Resp 20

## 2021-02-07 DIAGNOSIS — C61 Malignant neoplasm of prostate: Secondary | ICD-10-CM | POA: Insufficient documentation

## 2021-02-07 DIAGNOSIS — D696 Thrombocytopenia, unspecified: Secondary | ICD-10-CM

## 2021-02-07 LAB — CMP (CANCER CENTER ONLY)
ALT: 60 U/L — ABNORMAL HIGH (ref 0–44)
AST: 75 U/L — ABNORMAL HIGH (ref 15–41)
Albumin: 4.1 g/dL (ref 3.5–5.0)
Alkaline Phosphatase: 99 U/L (ref 38–126)
Anion gap: 10 (ref 5–15)
BUN: 12 mg/dL (ref 8–23)
CO2: 27 mmol/L (ref 22–32)
Calcium: 9.4 mg/dL (ref 8.9–10.3)
Chloride: 105 mmol/L (ref 98–111)
Creatinine: 0.94 mg/dL (ref 0.61–1.24)
GFR, Estimated: >60 mL/min (ref >60)
Glucose, Bld: 118 mg/dL — ABNORMAL HIGH (ref 70–99)
Potassium: 3.8 mmol/L (ref 3.5–5.1)
Sodium: 142 mmol/L (ref 135–145)
Total Bilirubin: 0.8 mg/dL (ref 0.3–1.2)
Total Protein: 8.3 g/dL — ABNORMAL HIGH (ref 6.5–8.1)

## 2021-02-07 LAB — PLATELET COUNT (CANCER CENTER ONLY): Platelet Count: 102 10*3/uL — ABNORMAL LOW (ref 150–400)

## 2021-02-07 LAB — CBC WITH DIFFERENTIAL/PLATELET
Abs Immature Granulocytes: 0.02 10*3/uL (ref 0.00–0.07)
Basophils Absolute: 0 10*3/uL (ref 0.0–0.1)
Basophils Relative: 1 %
Eosinophils Absolute: 0.2 10*3/uL (ref 0.0–0.5)
Eosinophils Relative: 3 %
HCT: 45.4 % (ref 39.0–52.0)
Hemoglobin: 15.5 g/dL (ref 13.0–17.0)
Immature Granulocytes: 0 %
Lymphocytes Relative: 26 %
Lymphs Abs: 1.6 10*3/uL (ref 0.7–4.0)
MCH: 31.3 pg (ref 26.0–34.0)
MCHC: 34.1 g/dL (ref 30.0–36.0)
MCV: 91.7 fL (ref 80.0–100.0)
Monocytes Absolute: 0.4 10*3/uL (ref 0.1–1.0)
Monocytes Relative: 6 %
Neutro Abs: 3.9 10*3/uL (ref 1.7–7.7)
Neutrophils Relative %: 64 %
Platelets: 102 10*3/uL — ABNORMAL LOW (ref 150–400)
RBC: 4.95 MIL/uL (ref 4.22–5.81)
RDW: 13.6 % (ref 11.5–15.5)
Smear Review: NORMAL
WBC: 6.1 10*3/uL (ref 4.0–10.5)
nRBC: 0 % (ref 0.0–0.2)

## 2021-02-07 LAB — IMMATURE PLATELET FRACTION: Immature Platelet Fraction: 7.5 % (ref 1.2–8.6)

## 2021-02-07 LAB — LACTATE DEHYDROGENASE: LDH: 229 U/L — ABNORMAL HIGH (ref 98–192)

## 2021-02-07 LAB — VITAMIN B12: Vitamin B-12: 343 pg/mL (ref 180–914)

## 2021-02-09 ENCOUNTER — Telehealth: Payer: Self-pay | Admitting: Hematology

## 2021-02-09 NOTE — Telephone Encounter (Signed)
Scheduled follow-up appointment per 11/2 los. Patient's wife is aware. 

## 2021-02-13 NOTE — Progress Notes (Addendum)
[name]   HEMATOLOGY/ONCOLOGY CONSULTATION NOTE  Date of Service: .02/07/2021  Patient Care Team: Wenda Low, MD as PCP - General (Internal Medicine) Cira Rue, RN Nurse Navigator as Registered Nurse (Medical Oncology)  CHIEF COMPLAINTS/PURPOSE OF CONSULTATION:  Thrombocytopenia  HISTORY OF PRESENTING ILLNESS:   Albert Andrews is a wonderful 70 y.o. male who has been referred to [place] by Dr .Wenda Low, MD  for evaluation and management of thrombocytopenia.  Patient has a history of hypertension, dyslipidemia, diabetes type 2, GERD who was diagnosed with prostate cancer few months back.  He has stage T1c adenocarcinoma prostate with Gleason score of 3+4 and a PSA of 0.21 on ADT. He had prostate seed implantation for brachytherapy on 09/01/2020 and has tolerated this well thus far.   He follows with Dr. Lovena Neighbours for urology and Dr. Ledon Snare for radiation oncology.  He notes no significant new urinary symptoms or GI symptoms. No recent infections. No recent medications.  He previously had a CBC on 08/29/2020 that showed platelet counts of 122k with normal hemoglobin and WBC count. Labs with his primary care physician have suggested slight additional drop in platelets.  Patient notes no bleeding issues.  No petechiae no purpura. No fevers no chills no night sweats No unexpected weight loss. No new bone pains.  MEDICAL HISTORY:  Past Medical History:  Diagnosis Date   DM type 2 (diabetes mellitus, type 2) ([place])    GERD (gastroesophageal reflux disease)    Hyperlipidemia    Melanoma ([place]) 08/02/2020   removed from both legs areas healed    Prostate cancer ([place])    Urinary frequency    Weakness of both lower extremities    due to dm uses power wc    SURGICAL HISTORY: Past Surgical History:  Procedure Laterality Date   ADENOIDECTOMY, TONSILLECTOMY AND MYRINGOTOMY WITH TUBE PLACEMENT  as child   BIOPSY  01/07/2020   Procedure: BIOPSY;  Surgeon: Carol Ada, MD;   Location: [facility];  Service: Endoscopy;;   COLONOSCOPY WITH PROPOFOL N/A 01/26/2021   Procedure: COLONOSCOPY WITH PROPOFOL;  Surgeon: Carol Ada, MD;  Location: [facility];  Service: Endoscopy;  Laterality: N/A;   ESOPHAGOGASTRODUODENOSCOPY (EGD) WITH PROPOFOL N/A 01/07/2020   Procedure: ESOPHAGOGASTRODUODENOSCOPY (EGD) WITH PROPOFOL;  Surgeon: Carol Ada, MD;  Location: [facility];  Service: Endoscopy;  Laterality: N/A;   POLYPECTOMY  01/26/2021   Procedure: POLYPECTOMY;  Surgeon: Carol Ada, MD;  Location: [facility];  Service: Endoscopy;;   PROSTATE BIOPSY     RADIOACTIVE SEED IMPLANT N/A 09/01/2020   Procedure: RADIOACTIVE SEED IMPLANT/BRACHYTHERAPY IMPLANT;  Surgeon: Ceasar Mons, MD;  Location: [facility];  Service: Urology;  Laterality: N/A;   ROTATOR CUFF REPAIR Left 1994   SPACE OAR INSTILLATION N/A 09/01/2020   Procedure: SPACE OAR INSTILLATION;  Surgeon: Ceasar Mons, MD;  Location: [facility];  Service: Urology;  Laterality: N/A;    SOCIAL HISTORY: Social History   Socioeconomic History   Marital status: Married    Spouse name: Not on file   Number of children: Not on file   Years of education: Not on file   Highest education level: Not on file  Occupational History    Comment: retired  Tobacco Use   Smoking status: Never   Smokeless tobacco: Never  Vaping Use   Vaping Use: Never used  Substance and Sexual Activity   Alcohol use: Not Currently   Drug use: No   Sexual activity: Not Currently  Other Topics  Concern   Not on file  Social History Narrative   Not on file   Social Determinants of Health   Financial Resource Strain: Not on file  Food Insecurity: No Food Insecurity   Worried About Running Out of Food in the Last Year: Never true   Ran Out of Food in the Last Year: Never true  Transportation Needs: Not on file  Physical Activity: Not on file  Stress: Not on file  Social  Connections: Not on file  Intimate Partner Violence: Not on file    FAMILY HISTORY: Family History  Problem Relation Age of Onset   Cancer Brother        unknown   Prostate cancer Neg Hx     ALLERGIES:  is allergic to jardiance [empagliflozin] and metformin and related.  MEDICATIONS:  Current Outpatient Medications  Medication Sig Dispense Refill   Blood Glucose Monitoring Suppl (FREESTYLE FREEDOM LITE) w/Device KIT USE TO CHECK GLUCOSE ONCE DAILY     Dulaglutide (TRULICITY) 1.5 UM/3.5TI SOPN Inject 1.5 mg into the skin every Sunday.     fexofenadine (ALLEGRA) 180 MG tablet Take 180 mg by mouth at bedtime.     Insulin Degludec (TRESIBA) 100 UNIT/ML SOLN Inject 50-75 Units into the skin See admin instructions. Inject 75 units in am and 50 units at bedtime     losartan (COZAAR) 25 MG tablet Take 25 mg by mouth at bedtime.     meclizine (ANTIVERT) 25 MG tablet Take 25 mg by mouth 2 (two) times daily as needed for dizziness.     mirtazapine (REMERON) 15 MG tablet Take 15 mg by mouth at bedtime.     NOVOLOG FLEXPEN 100 UNIT/ML FlexPen Inject 10-16 Units into the skin 3 (three) times daily before meals.     omeprazole (PRILOSEC) 40 MG capsule Take 40 mg by mouth 2 (two) times daily.     OVER THE COUNTER MEDICATION Take 3 tablets by mouth daily. balance of nature fruit     OVER THE COUNTER MEDICATION Take 3 tablets by mouth daily. balance of nature vegetables     pregabalin (LYRICA) 100 MG capsule Take 100 mg by mouth 2 (two) times daily.     simvastatin (ZOCOR) 10 MG tablet Take 10 mg by mouth every evening.      tamsulosin (FLOMAX) 0.4 MG CAPS capsule Take 0.4 mg by mouth 2 (two) times daily.     No current facility-administered medications for this visit.    REVIEW OF SYSTEMS:    10 Point review of Systems was done is negative except as noted above.  PHYSICAL EXAMINATION: ECOG PERFORMANCE STATUS: 1 - Symptomatic but completely ambulatory  . Vitals:   02/07/21 1322  BP: (!)  170/82  Pulse: 72  Resp: 20  Temp: 97.9 F (36.6 C)  SpO2: 96%   Filed Weights   .There is no height or weight on file to calculate BMI.  GENERAL:alert, in no acute distress and comfortable SKIN: no acute rashes, no significant lesions EYES: conjunctiva are pink and non-injected, sclera anicteric OROPHARYNX: MMM, no exudates, no oropharyngeal erythema or ulceration NECK: supple, no JVD LYMPH:  no palpable lymphadenopathy in the cervical, axillary or inguinal regions LUNGS: clear to auscultation b/l with normal respiratory effort HEART: regular rate & rhythm ABDOMEN:  normoactive bowel sounds , non tender, not distended. Extremity: no pedal edema PSYCH: alert & oriented x 3 with fluent speech NEURO: no focal motor/sensory deficits  LABORATORY DATA:  I have reviewed the data as  listed  . CBC Latest Ref Rng & Units 02/07/2021 02/07/2021 08/29/2020  WBC 4.0 - 10.5 K/uL 6.1 - 7.8  Hemoglobin 13.0 - 17.0 g/dL 15.5 - 14.9  Hematocrit 39.0 - 52.0 % 45.4 - 43.2  Platelets 150 - 400 K/uL 102(L) 102(L) 122(L)    . CMP Latest Ref Rng & Units 02/07/2021 08/29/2020 07/31/2013  Glucose 70 - 99 mg/dL 118(H) 246(H) 149(H)  BUN 8 - 23 mg/dL '12 15 23  ' Creatinine 0.61 - 1.24 mg/dL 0.94 0.76 1.00  Sodium 135 - 145 mmol/L 142 139 139  Potassium 3.5 - 5.1 mmol/L 3.8 3.8 4.0  Chloride 98 - 111 mmol/L 105 106 103  CO2 22 - 32 mmol/L '27 25 26  ' Calcium 8.9 - 10.3 mg/dL 9.4 8.9 9.1  Total Protein 6.5 - 8.1 g/dL 8.3(H) 7.6 7.5  Total Bilirubin 0.3 - 1.2 mg/dL 0.8 0.5 0.9  Alkaline Phos 38 - 126 U/L 99 86 81  AST 15 - 41 U/L 75(H) 83(H) 58(H)  ALT 0 - 44 U/L 60(H) 73(H) 79(H)     RADIOGRAPHIC STUDIES: I have personally reviewed the radiological images as listed and agreed with the findings in the report. No results found.  ASSESSMENT & PLAN:   70 year old male with  #1 mild isolated Thrombocytopenia No anemia or leukocytopenia Likely due to recent prostate cancer brachy radiation therapy  with radioactive seeds. He also has had severe fatty liver noted on his previous MRI imaging in 2013 and continues to have abnormal liver function tests that bring up the concern for possible Karlene Lineman related liver cirrhosis which could cause some hypersplenism leading to his thrombocytopenia as well. No bleeding issues at this time PLAN -Discussed available labs with the patient showing mild thrombocytopenia. -We discussed that at his current platelet count he does not have much of a risk of abnormal bleeding or bruising. -We discussed the concerns for mild thrombocytopenia being because likely from his recent radioactive seed placement or from his chronic liver disease. -Would recommend he follow-up with his primary care physician for repeat evaluation to rule out Karlene Lineman related liver cirrhosis that could also cause hypersplenism with or without splenomegaly. -Will get basic labs to evaluate to rule out pseudothrombocytopenia and to see if he is having appropriate bone marrow response. -Cannot rule out immune thrombocytopenia though cannot make a diagnosis of ITP with platelets of more than 100k -No other overt new medications or new supplements that suggest etiology of thrombocytopenia. -No recent blood transfusions. . Orders Placed This Encounter  Procedures   CBC with Differential/Platelet    Standing Status:   Future    Number of Occurrences:   1    Standing Expiration Date:   02/07/2022   CMP ([facility] only)    Standing Status:   Future    Number of Occurrences:   1    Standing Expiration Date:   02/07/2022   Vitamin B12    Standing Status:   Future    Number of Occurrences:   1    Standing Expiration Date:   02/07/2022   Lactate dehydrogenase    Standing Status:   Future    Number of Occurrences:   1    Standing Expiration Date:   02/07/2022   Immature Platelet Fraction    Standing Status:   Future    Number of Occurrences:   1    Standing Expiration Date:   02/07/2022   Platelet  Count ([facility] Only)    Platelet in  citrate to r/o pseudothrombocytopenia    Standing Status:   Future    Number of Occurrences:   1    Standing Expiration Date:   02/07/2022    Followup Labs today Phone visit with Dr Irene Limbo in 1 week  All of the patients questions were answered with apparent satisfaction. The patient knows to call the clinic with any problems, questions or concerns.  I spent 30 minutes counseling the patient face to face. The total time spent in the appointment was 45 minutes and more than 50% was on counseling and direct patient cares.    Sullivan Lone MD [place] AAHIVMS [facility] Hematology/Oncology Physician [facility]  (Office):       [phone] (Work cell):  [phone] (Fax):           [phone]

## 2021-02-14 DIAGNOSIS — M79672 Pain in left foot: Secondary | ICD-10-CM | POA: Diagnosis not present

## 2021-02-14 DIAGNOSIS — M79671 Pain in right foot: Secondary | ICD-10-CM | POA: Diagnosis not present

## 2021-02-14 NOTE — Addendum Note (Signed)
Addended by: Sullivan Lone on: 02/14/2021 03:00 AM   Modules accepted: Level of Service

## 2021-02-15 ENCOUNTER — Inpatient Hospital Stay (HOSPITAL_BASED_OUTPATIENT_CLINIC_OR_DEPARTMENT_OTHER): Payer: HMO | Admitting: Hematology

## 2021-02-15 DIAGNOSIS — D696 Thrombocytopenia, unspecified: Secondary | ICD-10-CM

## 2021-02-15 DIAGNOSIS — C61 Malignant neoplasm of prostate: Secondary | ICD-10-CM | POA: Diagnosis not present

## 2021-02-20 ENCOUNTER — Other Ambulatory Visit: Payer: Self-pay | Admitting: Gastroenterology

## 2021-02-20 DIAGNOSIS — D696 Thrombocytopenia, unspecified: Secondary | ICD-10-CM

## 2021-02-20 DIAGNOSIS — R7989 Other specified abnormal findings of blood chemistry: Secondary | ICD-10-CM

## 2021-02-21 ENCOUNTER — Ambulatory Visit
Admission: RE | Admit: 2021-02-21 | Discharge: 2021-02-21 | Disposition: A | Discharge status: Home / Self Care | Payer: HMO | Source: Ambulatory Visit | Attending: Gastroenterology | Admitting: Gastroenterology

## 2021-02-21 ENCOUNTER — Other Ambulatory Visit: Payer: Self-pay

## 2021-02-21 DIAGNOSIS — I700 Atherosclerosis of aorta: Secondary | ICD-10-CM | POA: Diagnosis not present

## 2021-02-21 DIAGNOSIS — R7989 Other specified abnormal findings of blood chemistry: Secondary | ICD-10-CM

## 2021-02-21 DIAGNOSIS — N289 Disorder of kidney and ureter, unspecified: Secondary | ICD-10-CM | POA: Diagnosis not present

## 2021-02-21 DIAGNOSIS — K8020 Calculus of gallbladder without cholecystitis without obstruction: Secondary | ICD-10-CM | POA: Diagnosis not present

## 2021-02-21 DIAGNOSIS — K760 Fatty (change of) liver, not elsewhere classified: Secondary | ICD-10-CM | POA: Diagnosis not present

## 2021-02-21 DIAGNOSIS — D696 Thrombocytopenia, unspecified: Secondary | ICD-10-CM

## 2021-02-21 MED ORDER — IOPAMIDOL (ISOVUE-300) INJECTION 61%
100.0000 mL | Freq: Once | INTRAVENOUS | Status: AC | PRN
  Administered 2021-02-21: 100 mL via INTRAVENOUS

## 2021-02-25 NOTE — Progress Notes (Signed)
[name]   HEMATOLOGY/ONCOLOGY PHONE VISIT NOTE  Date of Service: .02/15/2021  Patient Care Team: Wenda Low, MD as PCP - General (Internal Medicine) Cira Rue, RN Nurse Navigator as Registered Nurse (Medical Oncology)  CHIEF COMPLAINTS/PURPOSE OF CONSULTATION:  Thrombocytopenia  HISTORY OF PRESENTING ILLNESS:   Albert Andrews is a wonderful 70 y.o. male who has been referred to [place] by Dr .Wenda Low, MD  for evaluation and management of thrombocytopenia.  Patient has a history of hypertension, dyslipidemia, diabetes type 2, GERD who was diagnosed with prostate cancer few months back.  He has stage T1c adenocarcinoma prostate with Gleason score of 3+4 and a PSA of 0.21 on ADT. He had prostate seed implantation for brachytherapy on 09/01/2020 and has tolerated this well thus far.   He follows with Dr. Lovena Neighbours for urology and Dr. Ledon Snare for radiation oncology.  He notes no significant new urinary symptoms or GI symptoms. No recent infections. No recent medications.  He previously had a CBC on 08/29/2020 that showed platelet counts of 122k with normal hemoglobin and WBC count. Labs with his primary care physician have suggested slight additional drop in platelets.  Patient notes no bleeding issues.  No petechiae no purpura. No fevers no chills no night sweats No unexpected weight loss. No new bone pains.  INTERVAL HISTORY  .I connected with Albert Andrews on 02/25/21 at 11:40 AM EST by telephone visit and verified that I am speaking with the correct person using two identifiers.   I discussed the limitations, risks, security and privacy concerns of performing an evaluation and management service by telemedicine and the availability of in-person appointments. I also discussed with the patient that there may be a patient responsible charge related to this service. The patient expressed understanding and agreed to proceed.  Patient's location: home  Provider's location:  [facility]   Patient was called to review his labs sent out on 01/07/2021 for evaluation of his thrombocytopenia. CBC showed platelets of 102k which has been stable over the last 2 weeks and down from 122k on 08/29/2020.  Platelet counts were confirmed by smear. CMP shows persistently elevated AST and ALT for more than 9 years.  Patient was noted to have an MRI of the abdomen with and without contrast on 11/11/2011 which showed severe hepatic steatosis.  We discussed that there is concerned that this might have progressed to liver cirrhosis and could be also involved in his thrombocytopenia. B12 343 LDH 229 Immature platelet fraction 7.5 Patient notes no issues with bleeding or bruising. MEDICAL HISTORY:  Past Medical History:  Diagnosis Date   DM type 2 (diabetes mellitus, type 2) ([place])    GERD (gastroesophageal reflux disease)    Hyperlipidemia    Melanoma ([place]) 08/02/2020   removed from both legs areas healed    Prostate cancer ([place])    Urinary frequency    Weakness of both lower extremities    due to dm uses power wc    SURGICAL HISTORY: Past Surgical History:  Procedure Laterality Date   ADENOIDECTOMY, TONSILLECTOMY AND MYRINGOTOMY WITH TUBE PLACEMENT  as child   BIOPSY  01/07/2020   Procedure: BIOPSY;  Surgeon: Carol Ada, MD;  Location: [facility];  Service: Endoscopy;;   COLONOSCOPY WITH PROPOFOL N/A 01/26/2021   Procedure: COLONOSCOPY WITH PROPOFOL;  Surgeon: Carol Ada, MD;  Location: [facility];  Service: Endoscopy;  Laterality: N/A;   ESOPHAGOGASTRODUODENOSCOPY (EGD) WITH PROPOFOL N/A 01/07/2020   Procedure: ESOPHAGOGASTRODUODENOSCOPY (EGD) WITH PROPOFOL;  Surgeon:  Carol Ada, MD;  Location: [facility];  Service: Endoscopy;  Laterality: N/A;   POLYPECTOMY  01/26/2021   Procedure: POLYPECTOMY;  Surgeon: Carol Ada, MD;  Location: [facility];  Service: Endoscopy;;   PROSTATE BIOPSY     RADIOACTIVE SEED IMPLANT N/A 09/01/2020   Procedure:  RADIOACTIVE SEED IMPLANT/BRACHYTHERAPY IMPLANT;  Surgeon: Ceasar Mons, MD;  Location[facility];  Service: Urology;  Laterality: N/A;   ROTATOR CUFF REPAIR Left 1994   SPACE OAR INSTILLATION N/A 09/01/2020   Procedure: SPACE OAR INSTILLATION;  Surgeon: Ceasar Mons, MD;  Location: [facility];  Service: Urology;  Laterality: N/A;    SOCIAL HISTORY: Social History   Socioeconomic History   Marital status: Married    Spouse name: Not on file   Number of children: Not on file   Years of education: Not on file   Highest education level: Not on file  Occupational History    Comment: retired  Tobacco Use   Smoking status: Never   Smokeless tobacco: Never  Vaping Use   Vaping Use: Never used  Substance and Sexual Activity   Alcohol use: Not Currently   Drug use: No   Sexual activity: Not Currently  Other Topics Concern   Not on file  Social History Narrative   Not on file   Social Determinants of Health   Financial Resource Strain: Not on file  Food Insecurity: No Food Insecurity   Worried About Charity fundraiser in the Last Year: Never true   Ran Out of Food in the Last Year: Never true  Transportation Needs: Not on file  Physical Activity: Not on file  Stress: Not on file  Social Connections: Not on file  Intimate Partner Violence: Not on file    FAMILY HISTORY: Family History  Problem Relation Age of Onset   Cancer Brother        unknown   Prostate cancer Neg Hx     ALLERGIES:  is allergic to jardiance [empagliflozin] and metformin and related.  MEDICATIONS:  Current Outpatient Medications  Medication Sig Dispense Refill   Andrews Glucose Monitoring Suppl (FREESTYLE FREEDOM LITE) w/Device KIT USE TO CHECK GLUCOSE ONCE DAILY     Dulaglutide (TRULICITY) 1.5 NW/2.9FA SOPN Inject 1.5 mg into the skin every Sunday.     fexofenadine (ALLEGRA) 180 MG tablet Take 180 mg by mouth at bedtime.     Insulin Degludec  (TRESIBA) 100 UNIT/ML SOLN Inject 50-75 Units into the skin See admin instructions. Inject 75 units in am and 50 units at bedtime     losartan (COZAAR) 25 MG tablet Take 25 mg by mouth at bedtime.     meclizine (ANTIVERT) 25 MG tablet Take 25 mg by mouth 2 (two) times daily as needed for dizziness.     mirtazapine (REMERON) 15 MG tablet Take 15 mg by mouth at bedtime.     NOVOLOG FLEXPEN 100 UNIT/ML FlexPen Inject 10-16 Units into the skin 3 (three) times daily before meals.     omeprazole (PRILOSEC) 40 MG capsule Take 40 mg by mouth 2 (two) times daily.     OVER THE COUNTER MEDICATION Take 3 tablets by mouth daily. balance of nature fruit     OVER THE COUNTER MEDICATION Take 3 tablets by mouth daily. balance of nature vegetables     pregabalin (LYRICA) 100 MG capsule Take 100 mg by mouth 2 (two) times daily.     simvastatin (ZOCOR) 10 MG tablet Take 10  mg by mouth every evening.      tamsulosin (FLOMAX) 0.4 MG CAPS capsule Take 0.4 mg by mouth 2 (two) times daily.     No current facility-administered medications for this visit.    REVIEW OF SYSTEMS:    .10 Point review of Systems was done is negative except as noted above.   PHYSICAL EXAMINATION: Telemedicine visit LABORATORY DATA:  I have reviewed the data as listed  . CBC Latest Ref Rng & Units 02/07/2021 02/07/2021 08/29/2020  WBC 4.0 - 10.5 K/uL 6.1 - 7.8  Hemoglobin 13.0 - 17.0 g/dL 15.5 - 14.9  Hematocrit 39.0 - 52.0 % 45.4 - 43.2  Platelets 150 - 400 K/uL 102(L) 102(L) 122(L)    . CMP Latest Ref Rng & Units 02/07/2021 08/29/2020 07/31/2013  Glucose 70 - 99 mg/dL 118(H) 246(H) 149(H)  BUN 8 - 23 mg/dL '12 15 23  ' Creatinine 0.61 - 1.24 mg/dL 0.94 0.76 1.00  Sodium 135 - 145 mmol/L 142 139 139  Potassium 3.5 - 5.1 mmol/L 3.8 3.8 4.0  Chloride 98 - 111 mmol/L 105 106 103  CO2 22 - 32 mmol/L '27 25 26  ' Calcium 8.9 - 10.3 mg/dL 9.4 8.9 9.1  Total Protein 6.5 - 8.1 g/dL 8.3(H) 7.6 7.5  Total Bilirubin 0.3 - 1.2 mg/dL 0.8 0.5 0.9   Alkaline Phos 38 - 126 U/L 99 86 81  AST 15 - 41 U/L 75(H) 83(H) 58(H)  ALT 0 - 44 U/L 60(H) 73(H) 79(H)   IR consultation for possible Trans-arterial therapies.   Surgery referral to Dr Barry Dienes for consideration of surgical treatment options  Phone visit with Dr. Irene Limbo in 3 weeks  RADIOGRAPHIC STUDIES: I have personally reviewed the radiological images as listed and agreed with the findings in the report.  ASSESSMENT & PLAN:   70 year old male with  #1 Mild isolated Thrombocytopenia No anemia or leukocytopenia Likely due to combination of possible liver cirrhosis due to NASH with hypersplenism/splenomegaly and due to recent prostate cancer brachy radiation therapy with radioactive seeds. He has had severe fatty liver noted on his previous MRI imaging in 2013 and continues to have abnormal liver function tests that bring up the concern for possible Karlene Lineman related liver cirrhosis which could cause some hypersplenism leading to his thrombocytopenia as well. No bleeding issues at this time PLAN -Discussed available labs with the patient showing mild thrombocytopenia. -CBC showed platelets of 102k which has been stable over the last 2 weeks and down from 122k on 08/29/2020.  Platelet counts were confirmed by smear. CMP shows persistently elevated AST and ALT for more than 9 years.  Patient was noted to have an MRI of the abdomen with and without contrast on 11/11/2011 which showed severe hepatic steatosis.  We discussed that there is concerned that this might have progressed to liver cirrhosis and could be also involved in his thrombocytopenia. B12 343 LDH 229 Immature platelet fraction 7.5 --Would recommend he follow-up with his primary care physician for repeat evaluation to rule out Karlene Lineman related liver cirrhosis that could also cause hypersplenism with or without splenomegaly. -Will get basic labs to evaluate to rule out pseudothrombocytopenia and to see if he is having appropriate bone marrow  response. -Cannot rule out immune thrombocytopenia though cannot make a diagnosis of ITP with platelets of more than 100k -No other overt new medications or new supplements that suggest etiology of thrombocytopenia. -Would recommend he continue to follow his platelets with his primary care physician every 4 to 6 months and  we could be reconsulted if his platelets are below 70k, -Patient notes she will get in touch with Dr. Carol Ada his gastroenterologist to follow-up regarding his NASH and possible liver cirrhosis. . No orders of the defined types were placed in this encounter.  Followup RTC with Dr Irene Limbo as needed  All of the patients questions were answered with apparent satisfaction. The patient knows to call the clinic with any problems, questions or concerns.  . The total time spent in the appointment was 20 minutes and more than 50% was on counseling and direct patient cares.    Sullivan Lone MD [place] AAHIVMS [facility] Hematology/Oncology Physician [facility]

## 2021-03-16 DIAGNOSIS — M79672 Pain in left foot: Secondary | ICD-10-CM | POA: Diagnosis not present

## 2021-03-16 DIAGNOSIS — M79671 Pain in right foot: Secondary | ICD-10-CM | POA: Diagnosis not present

## 2021-03-20 DIAGNOSIS — I700 Atherosclerosis of aorta: Secondary | ICD-10-CM | POA: Diagnosis not present

## 2021-03-20 DIAGNOSIS — C61 Malignant neoplasm of prostate: Secondary | ICD-10-CM | POA: Diagnosis not present

## 2021-03-20 DIAGNOSIS — I10 Essential (primary) hypertension: Secondary | ICD-10-CM | POA: Diagnosis not present

## 2021-03-20 DIAGNOSIS — M519 Unspecified thoracic, thoracolumbar and lumbosacral intervertebral disc disorder: Secondary | ICD-10-CM | POA: Diagnosis not present

## 2021-03-20 DIAGNOSIS — E785 Hyperlipidemia, unspecified: Secondary | ICD-10-CM | POA: Diagnosis not present

## 2021-03-20 DIAGNOSIS — E1140 Type 2 diabetes mellitus with diabetic neuropathy, unspecified: Secondary | ICD-10-CM | POA: Diagnosis not present

## 2021-03-20 DIAGNOSIS — E1165 Type 2 diabetes mellitus with hyperglycemia: Secondary | ICD-10-CM | POA: Diagnosis not present

## 2021-03-20 DIAGNOSIS — K7460 Unspecified cirrhosis of liver: Secondary | ICD-10-CM | POA: Diagnosis not present

## 2021-04-05 DIAGNOSIS — E781 Pure hyperglyceridemia: Secondary | ICD-10-CM | POA: Diagnosis not present

## 2021-04-05 DIAGNOSIS — E162 Hypoglycemia, unspecified: Secondary | ICD-10-CM | POA: Diagnosis not present

## 2021-04-05 DIAGNOSIS — C61 Malignant neoplasm of prostate: Secondary | ICD-10-CM | POA: Diagnosis not present

## 2021-04-05 DIAGNOSIS — K7460 Unspecified cirrhosis of liver: Secondary | ICD-10-CM | POA: Diagnosis not present

## 2021-04-05 DIAGNOSIS — E1165 Type 2 diabetes mellitus with hyperglycemia: Secondary | ICD-10-CM | POA: Diagnosis not present

## 2021-04-16 DIAGNOSIS — M79671 Pain in right foot: Secondary | ICD-10-CM | POA: Diagnosis not present

## 2021-04-16 DIAGNOSIS — M79672 Pain in left foot: Secondary | ICD-10-CM | POA: Diagnosis not present

## 2021-04-19 IMAGING — RF SWALLOWING FUNCTION
7 series · 24 of 24 positions shown · non-contrast
Comparison: None.

CLINICAL DATA: Dysphagia with difficulty swallowing. New diagnosis
amyotrophic lateral sclerosis.

EXAM:
MODIFIED BARIUM SWALLOW
TECHNIQUE: Different consistencies of barium were administered orally to the
patient by the Speech Pathologist. Imaging of the pharynx was
performed in the lateral projection. The radiologist was present in
the fluoroscopy room for this study, providing personal supervision.
FLUOROSCOPY TIME:  Fluoroscopy Time:  1 minutes
Radiation Exposure Index (if provided by the fluoroscopic device):
3.4 mGy
Number of Acquired Spot Images: 0

[Series 1: cp_standard · 0.25mm/px · 1 of 1 slices shown (1 of 7)]
[im 1/1]
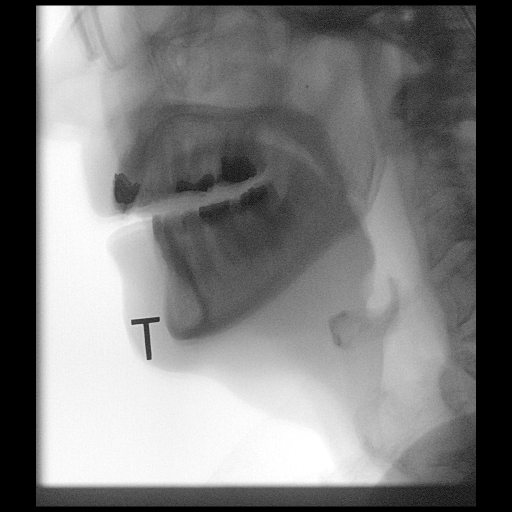

[Series 2: cp_standard · 0.51mm/px · 4 of 89 frames shown (2 of 7)]
[frame 14/89]
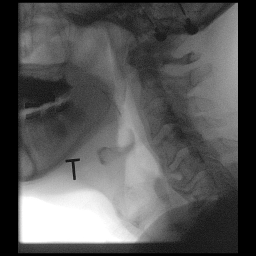
[frame 45/89]
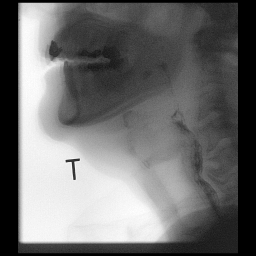
[frame 72/89]
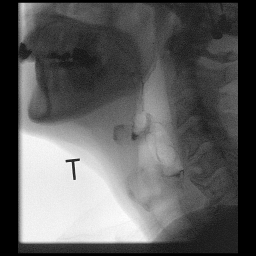
[frame 76/89]
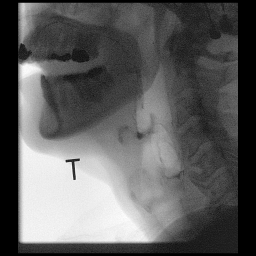

[Series 3: cp_standard · 0.51mm/px · 4 of 238 frames shown (3 of 7)]
[frame 36/238]
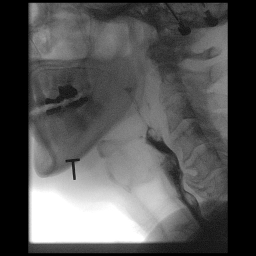
[frame 120/238]
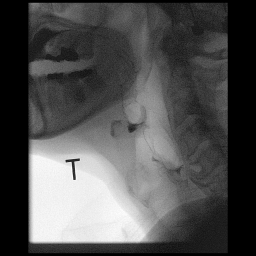
[frame 138/238]
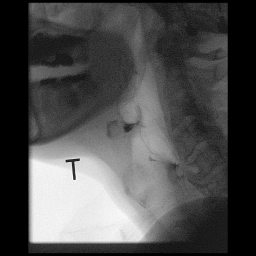
[frame 203/238]
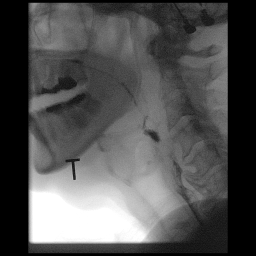

[Series 4: cp_standard · 0.51mm/px · 3 of 260 frames shown (4 of 7)]
[frame 40/260]
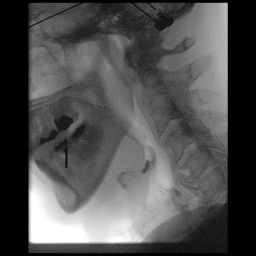
[frame 131/260]
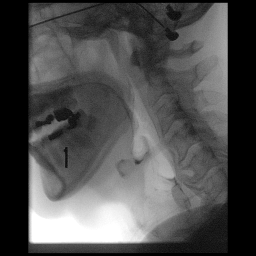
[frame 222/260]
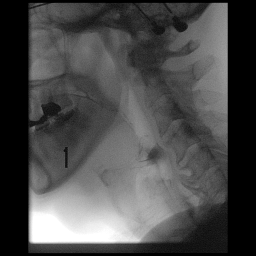

[Series 5: cp_standard · 0.51mm/px · 4 of 157 frames shown (5 of 7)]
[frame 1/157]
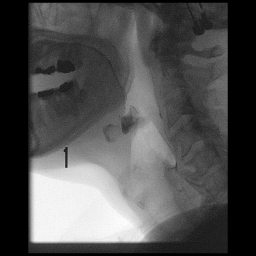
[frame 24/157]
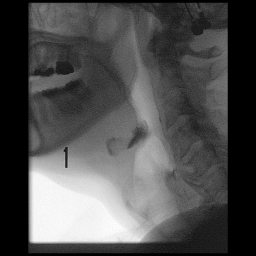
[frame 79/157]
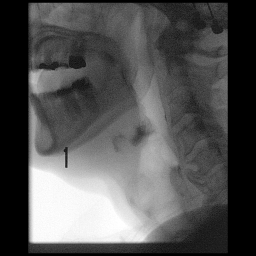
[frame 134/157]
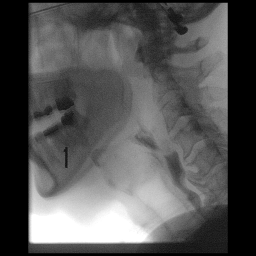

[Series 6: cp_standard · 0.51mm/px · 4 of 274 frames shown (6 of 7)]
[frame 17/274]
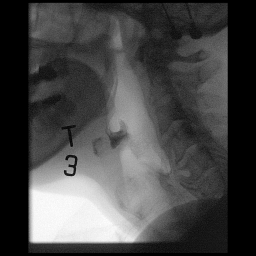
[frame 42/274]
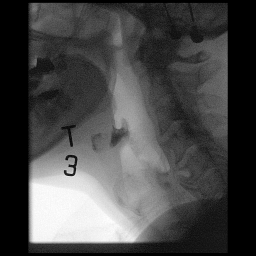
[frame 138/274]
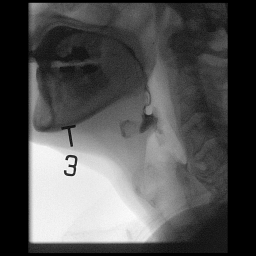
[frame 233/274]
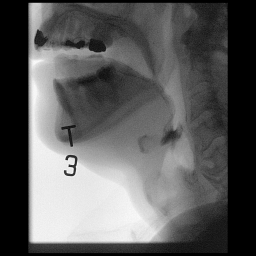

[Series 7: cp_standard · 0.51mm/px · 4 of 32 frames shown (7 of 7)]
[frame 5/32]
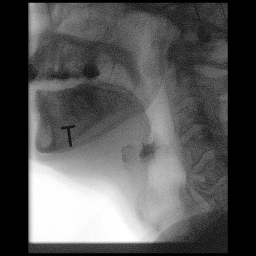
[frame 13/32]
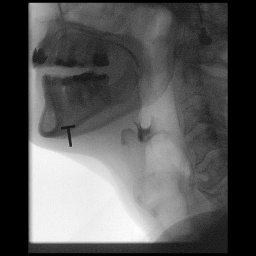
[frame 17/32]
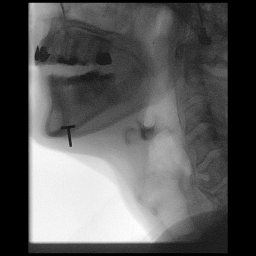
[frame 28/32]
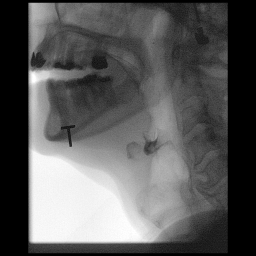

[24 of 24 positions shown; findings below may reference images not displayed]

FINDINGS: The swallowing function is within normal limits. No laryngeal
penetration or aspiration identified. There was oral delay in
swallowing the pill which the patient reports to be longstanding.
IMPRESSION: No significant swallowing dysfunction.

Please refer to the Speech Pathologists report for complete details
and recommendations.

## 2021-04-26 ENCOUNTER — Other Ambulatory Visit: Payer: Self-pay

## 2021-04-26 ENCOUNTER — Ambulatory Visit: Payer: HMO | Admitting: Internal Medicine

## 2021-04-26 ENCOUNTER — Encounter: Payer: Self-pay | Admitting: Internal Medicine

## 2021-04-26 DIAGNOSIS — R058 Other specified cough: Secondary | ICD-10-CM

## 2021-04-26 NOTE — Progress Notes (Signed)
Albert Andrews, male    DOB: 08/03/1950   MRN: [id number]   Brief patient profile:  66 yowf never smoker  varsity baseball player for  worked at [employer] then legs gave out R then L  late in Dec 2019 ? W/u at [facility] and [place] ? ALS  But improving in Nov 2020 to point of walking daily with "up and go" walker doing driveway walking x 4 laps = several hundred feet  Stops due to arms give out before breathing but wife concerned mostly about chronic cough so self referred to pulmonary clinic 10/01/2019     History of Present Illness  10/01/2019  Pulmonary/ 1st office eval/Abhijot Straughter  Chief Complaint  Patient presents with   Pulmonary Consult    Self referral. Spouse concerned about nocturnal cough- pt denies this.   Dyspnea: Pt not aware of sob but severely limited by leg weakness and deconditioning Cough: x 6 months sporadic p hs / typically sleeping almost flat  Sleep: pt says sleep fine but feels fine and not aware of cough or throat clearing.  SABA use: none Rec GERD diet reviewed, bed blocks rec  For drainage / throat tickle try take CHLORPHENIRAMINE  4 mg   Pulmonary follow up is as needed --  Might consider ENT next p first adding max acid suppression next     04/26/2021  f/u ov/[facility] re: uacs    maint on chlortrimeton 4 mg qhs, no inhalers   Chief Complaint  Patient presents with   Follow-up    C/o PND   Dyspnea:  walks with walker  - does eliptical and bicycle ergometer s doe 5 days a week  Cough: denies daytime but most nights sleeps flat on side one pillow  Sleeping: as above  SABA use: none  02: none  Covid status:   vax 3    No obvious day to day or daytime variability or assoc excess/ purulent sputum or mucus plugs or hemoptysis or cp or chest tightness, subjective wheeze or overt  hb symptoms.     Also denies any obvious fluctuation of symptoms with weather or environmental changes or other aggravating or alleviating factors except as outlined above   No  unusual exposure hx or h/o childhood pna/ asthma or knowledge of premature birth.  Current Allergies, Complete Past Medical History, Past Surgical History, Family History, and Social History were reviewed in [employer] record.  ROS  The following are not active complaints unless bolded Hoarseness, sore throat, dysphagia, dental problems, itching, sneezing,  nasal congestion or discharge of excess mucus or purulent secretions, ear ache,   fever, chills, sweats, unintended wt loss or wt gain, classically pleuritic or exertional cp,  orthopnea pnd or arm/hand swelling  or leg swelling, presyncope, palpitations, abdominal pain, anorexia, nausea, vomiting, diarrhea  or change in bowel habits or change in bladder habits, change in stools or change in urine, dysuria, hematuria,  rash, arthralgias, visual complaints, headache, numbness, weakness or ataxia or problems with walking all improving x one year or coordination,  change in mood or  memory.        Current Meds  Medication Sig   Blood Glucose Monitoring Suppl (FREESTYLE FREEDOM LITE) w/Device KIT USE TO CHECK GLUCOSE ONCE DAILY   Dulaglutide (TRULICITY) 1.5 ER/1.5QM SOPN Inject 1.5 mg into the skin every Sunday.   fexofenadine (ALLEGRA) 180 MG tablet Take 180 mg by mouth at bedtime.   Insulin Degludec (TRESIBA) 100 UNIT/ML SOLN  Inject 50-75 Units into the skin See admin instructions. Inject 75 units in am and 50 units at bedtime   losartan (COZAAR) 25 MG tablet Take 25 mg by mouth at bedtime.   meclizine (ANTIVERT) 25 MG tablet Take 25 mg by mouth 2 (two) times daily as needed for dizziness.   mirtazapine (REMERON) 15 MG tablet Take 15 mg by mouth at bedtime.   NOVOLOG FLEXPEN 100 UNIT/ML FlexPen Inject 10-16 Units into the skin 3 (three) times daily before meals.   omeprazole (PRILOSEC) 40 MG capsule Take 40 mg by mouth 2 (two) times daily.   OVER THE COUNTER MEDICATION Take 3 tablets by mouth daily. balance of nature fruit    OVER THE COUNTER MEDICATION Take 3 tablets by mouth daily. balance of nature vegetables   simvastatin (ZOCOR) 10 MG tablet Take 10 mg by mouth every evening.    tamsulosin (FLOMAX) 0.4 MG CAPS capsule Take 0.4 mg by mouth 2 (two) times daily.            Past Medical History:  Diagnosis Date   Hyperlipidemia    Hypertension    Prostate cancer ([place])         Objective:     04/26/2021       241   01/26/21 240 lb (108.9 kg)  09/01/20 215 lb (97.5 kg)  08/29/20 215 lb (97.5 kg)      Vital signs reviewed  04/26/2021  - Note at rest 02 sats  95% on RA   General appearance:    w/c bound hoarse pleasant wm nad  / occ throat clearing   HEENT : pt wearing mask not removed for exam due to covid -19 concerns.    NECK :  without JVD/Nodes/TM/ nl carotid upstrokes bilaterally   LUNGS: no acc muscle use,  Nl contour chest which is clear to A and P bilaterally without cough on insp or exp maneuvers   CV:  RRR  no s3 or murmur or increase in P2, and no edema   ABD:  soft and nontender with nl inspiratory excursion in the supine position. No bruits or organomegaly appreciated, bowel sounds nl  MS: ext warm without deformities, calf tenderness, cyanosis or clubbing No obvious joint restrictions   SKIN: warm and dry without lesions    NEURO:  alert, approp, nl sensorium with no obvious motor or cerebellar deficits apparent.       I personally reviewed images and agree with radiology impression as follows:  CXR:   portable 09/13/20  Cardiac shadow is stable. Right basilar atelectasis is noted stable from the prior study. No new focal infiltrate or sizable effusion is seen. No bony abnormality is noted.    Assessment

## 2021-04-26 NOTE — Assessment & Plan Note (Addendum)
Onset around jan 2021 in setting of unexplained leg weakness  - ST eval 10/14/2018 difficulty initiating swallow of tablet but no evidence of aspiration > rec no dietary restrictions  - Spirometry 05/19/19   FEV1 2.16  (61%)  Ratio 0.76  With min curature on exp f/v  His weakness has continued to improved so clearly not ALS related and swallowing is fine as well but has persistent daytime nonproductive cough with sensation of pnds vs globus better hs after 1st gen H1 blockers per guidelines    rec Double the h1 at hs since this is when he has the most symptoms and max gerd rx including elevation of the [facility] >>>  f/u re hoarseness/ globus with ENT / Dr Joya Gaskins at [facility] where his previous w/u was completed to include speech therapy eval.   I had an extended discussion with the patient reviewing all relevant studies completed to date and  lasting 15 to 20 minutes of a 25 minute visit re:  Discussed in detail all the  indications, usual  risks and alternatives  relative to the benefits with patient who agrees to proceed with rx and w/u as outlined.            Each maintenance medication was reviewed in detail including emphasizing most importantly the difference between maintenance and prns and under what circumstances the prns are to be triggered using an action plan format where appropriate.  Total time for H and P, chart review, counseling,  and generating customized AVS unique to this office visit / same day charting = 30 min for this pt not seen in over a year.

## 2021-04-26 NOTE — Patient Instructions (Addendum)
For drainage / throat tickle try take CHLORPHENIRAMINE  4 mg  (Chlortab 4mg   at [employer] should be easiest to find in the green box)  take one every 4 hours as needed - available over the counter- may cause drowsiness so start with just   two an hour before bedtime and see how you tolerate it before trying in daytime up to one every 4 hours as needed   GERD (REFLUX)  is an extremely common cause of respiratory symptoms just like yours , many times with no obvious heartburn at all.    It can be treated with medication, but also with lifestyle changes including elevation of the head of your bed (ideally with 6 -8inch blocks under the headboard of your bed),  Smoking cessation, avoidance of late meals, excessive alcohol, and avoid fatty foods, chocolate, peppermint, colas, red wine, and acidic juices such as orange juice.  NO MINT OR MENTHOL PRODUCTS SO NO COUGH DROPS  USE SUGARLESS CANDY INSTEAD (Jolley ranchers or Stover's or Life Savers) or even ice chips will also do - the key is to swallow to prevent all throat clearing. NO OIL BASED VITAMINS - use powdered substitutes.  Avoid fish oil when coughing.    I will be referring you to Dr Carol Ada at [facility]   Pulmonary follow up is as needed

## 2021-05-03 ENCOUNTER — Telehealth: Payer: Self-pay | Admitting: Internal Medicine

## 2021-05-03 DIAGNOSIS — R058 Other specified cough: Secondary | ICD-10-CM

## 2021-05-03 NOTE — Telephone Encounter (Signed)
ATC Amy about referral, looking in the patients chart it looks like order was not placed. Order has now been placed and I let her know that on voicemail.Nothing further needed at this time.

## 2021-05-03 NOTE — Telephone Encounter (Signed)
I do not see any orders that were placed for the patient

## 2021-05-07 DIAGNOSIS — Z85820 Personal history of malignant melanoma of skin: Secondary | ICD-10-CM | POA: Diagnosis not present

## 2021-05-07 DIAGNOSIS — D225 Melanocytic nevi of trunk: Secondary | ICD-10-CM | POA: Diagnosis not present

## 2021-05-07 DIAGNOSIS — C44519 Basal cell carcinoma of skin of other part of trunk: Secondary | ICD-10-CM | POA: Diagnosis not present

## 2021-05-07 DIAGNOSIS — L814 Other melanin hyperpigmentation: Secondary | ICD-10-CM | POA: Diagnosis not present

## 2021-05-07 DIAGNOSIS — L570 Actinic keratosis: Secondary | ICD-10-CM | POA: Diagnosis not present

## 2021-05-07 DIAGNOSIS — Z85828 Personal history of other malignant neoplasm of skin: Secondary | ICD-10-CM | POA: Diagnosis not present

## 2021-05-09 DIAGNOSIS — K7460 Unspecified cirrhosis of liver: Secondary | ICD-10-CM | POA: Diagnosis not present

## 2021-05-09 DIAGNOSIS — E119 Type 2 diabetes mellitus without complications: Secondary | ICD-10-CM | POA: Diagnosis not present

## 2021-05-09 DIAGNOSIS — E6609 Other obesity due to excess calories: Secondary | ICD-10-CM | POA: Diagnosis not present

## 2021-05-17 DIAGNOSIS — M79671 Pain in right foot: Secondary | ICD-10-CM | POA: Diagnosis not present

## 2021-05-17 DIAGNOSIS — M79672 Pain in left foot: Secondary | ICD-10-CM | POA: Diagnosis not present

## 2021-06-11 DIAGNOSIS — C61 Malignant neoplasm of prostate: Secondary | ICD-10-CM | POA: Diagnosis not present

## 2021-06-14 DIAGNOSIS — M79672 Pain in left foot: Secondary | ICD-10-CM | POA: Diagnosis not present

## 2021-06-14 DIAGNOSIS — M79671 Pain in right foot: Secondary | ICD-10-CM | POA: Diagnosis not present

## 2021-06-25 DIAGNOSIS — J385 Laryngeal spasm: Secondary | ICD-10-CM | POA: Diagnosis not present

## 2021-06-25 DIAGNOSIS — J3800 Paralysis of vocal cords and larynx, unspecified: Secondary | ICD-10-CM | POA: Diagnosis not present

## 2021-06-25 DIAGNOSIS — R0989 Other specified symptoms and signs involving the circulatory and respiratory systems: Secondary | ICD-10-CM | POA: Diagnosis not present

## 2021-06-25 DIAGNOSIS — J3802 Paralysis of vocal cords and larynx, bilateral: Secondary | ICD-10-CM | POA: Diagnosis not present

## 2021-06-25 DIAGNOSIS — R058 Other specified cough: Secondary | ICD-10-CM | POA: Diagnosis not present

## 2021-06-27 DIAGNOSIS — H353131 Nonexudative age-related macular degeneration, bilateral, early dry stage: Secondary | ICD-10-CM | POA: Diagnosis not present

## 2021-06-27 DIAGNOSIS — E119 Type 2 diabetes mellitus without complications: Secondary | ICD-10-CM | POA: Diagnosis not present

## 2021-06-27 DIAGNOSIS — H4911 Fourth [trochlear] nerve palsy, right eye: Secondary | ICD-10-CM | POA: Diagnosis not present

## 2021-06-27 DIAGNOSIS — H2513 Age-related nuclear cataract, bilateral: Secondary | ICD-10-CM | POA: Diagnosis not present

## 2021-07-03 DIAGNOSIS — G1221 Amyotrophic lateral sclerosis: Secondary | ICD-10-CM | POA: Diagnosis not present

## 2021-07-09 DIAGNOSIS — E1140 Type 2 diabetes mellitus with diabetic neuropathy, unspecified: Secondary | ICD-10-CM | POA: Diagnosis not present

## 2021-07-09 DIAGNOSIS — I700 Atherosclerosis of aorta: Secondary | ICD-10-CM | POA: Diagnosis not present

## 2021-07-09 DIAGNOSIS — D696 Thrombocytopenia, unspecified: Secondary | ICD-10-CM | POA: Diagnosis not present

## 2021-07-09 DIAGNOSIS — Z8546 Personal history of malignant neoplasm of prostate: Secondary | ICD-10-CM | POA: Diagnosis not present

## 2021-07-09 DIAGNOSIS — K7460 Unspecified cirrhosis of liver: Secondary | ICD-10-CM | POA: Diagnosis not present

## 2021-07-09 DIAGNOSIS — I10 Essential (primary) hypertension: Secondary | ICD-10-CM | POA: Diagnosis not present

## 2021-07-15 DIAGNOSIS — M79671 Pain in right foot: Secondary | ICD-10-CM | POA: Diagnosis not present

## 2021-07-15 DIAGNOSIS — M79672 Pain in left foot: Secondary | ICD-10-CM | POA: Diagnosis not present

## 2021-07-16 DIAGNOSIS — R051 Acute cough: Secondary | ICD-10-CM | POA: Diagnosis not present

## 2021-08-14 DIAGNOSIS — M79671 Pain in right foot: Secondary | ICD-10-CM | POA: Diagnosis not present

## 2021-08-14 DIAGNOSIS — M79672 Pain in left foot: Secondary | ICD-10-CM | POA: Diagnosis not present

## 2021-09-14 DIAGNOSIS — M79672 Pain in left foot: Secondary | ICD-10-CM | POA: Diagnosis not present

## 2021-09-14 DIAGNOSIS — M79671 Pain in right foot: Secondary | ICD-10-CM | POA: Diagnosis not present

## 2021-09-24 DIAGNOSIS — K219 Gastro-esophageal reflux disease without esophagitis: Secondary | ICD-10-CM | POA: Diagnosis not present

## 2021-09-24 DIAGNOSIS — E1140 Type 2 diabetes mellitus with diabetic neuropathy, unspecified: Secondary | ICD-10-CM | POA: Diagnosis not present

## 2021-09-24 DIAGNOSIS — I10 Essential (primary) hypertension: Secondary | ICD-10-CM | POA: Diagnosis not present

## 2021-09-24 DIAGNOSIS — E785 Hyperlipidemia, unspecified: Secondary | ICD-10-CM | POA: Diagnosis not present

## 2021-10-14 DIAGNOSIS — M79671 Pain in right foot: Secondary | ICD-10-CM | POA: Diagnosis not present

## 2021-10-14 DIAGNOSIS — M79672 Pain in left foot: Secondary | ICD-10-CM | POA: Diagnosis not present

## 2021-10-15 DIAGNOSIS — E785 Hyperlipidemia, unspecified: Secondary | ICD-10-CM | POA: Diagnosis not present

## 2021-10-15 DIAGNOSIS — I10 Essential (primary) hypertension: Secondary | ICD-10-CM | POA: Diagnosis not present

## 2021-10-15 DIAGNOSIS — K219 Gastro-esophageal reflux disease without esophagitis: Secondary | ICD-10-CM | POA: Diagnosis not present

## 2021-10-15 DIAGNOSIS — D696 Thrombocytopenia, unspecified: Secondary | ICD-10-CM | POA: Diagnosis not present

## 2021-10-15 DIAGNOSIS — C61 Malignant neoplasm of prostate: Secondary | ICD-10-CM | POA: Diagnosis not present

## 2021-10-15 DIAGNOSIS — K7460 Unspecified cirrhosis of liver: Secondary | ICD-10-CM | POA: Diagnosis not present

## 2021-10-15 DIAGNOSIS — I700 Atherosclerosis of aorta: Secondary | ICD-10-CM | POA: Diagnosis not present

## 2021-10-15 DIAGNOSIS — E1140 Type 2 diabetes mellitus with diabetic neuropathy, unspecified: Secondary | ICD-10-CM | POA: Diagnosis not present

## 2021-10-18 DIAGNOSIS — C61 Malignant neoplasm of prostate: Secondary | ICD-10-CM | POA: Diagnosis not present

## 2021-10-18 DIAGNOSIS — E1165 Type 2 diabetes mellitus with hyperglycemia: Secondary | ICD-10-CM | POA: Diagnosis not present

## 2021-10-18 DIAGNOSIS — E781 Pure hyperglyceridemia: Secondary | ICD-10-CM | POA: Diagnosis not present

## 2021-10-18 DIAGNOSIS — K7460 Unspecified cirrhosis of liver: Secondary | ICD-10-CM | POA: Diagnosis not present

## 2021-10-18 DIAGNOSIS — I10 Essential (primary) hypertension: Secondary | ICD-10-CM | POA: Diagnosis not present

## 2021-10-18 DIAGNOSIS — E162 Hypoglycemia, unspecified: Secondary | ICD-10-CM | POA: Diagnosis not present

## 2021-10-18 DIAGNOSIS — G629 Polyneuropathy, unspecified: Secondary | ICD-10-CM | POA: Diagnosis not present

## 2021-11-05 DIAGNOSIS — Z85828 Personal history of other malignant neoplasm of skin: Secondary | ICD-10-CM | POA: Diagnosis not present

## 2021-11-05 DIAGNOSIS — L814 Other melanin hyperpigmentation: Secondary | ICD-10-CM | POA: Diagnosis not present

## 2021-11-05 DIAGNOSIS — L570 Actinic keratosis: Secondary | ICD-10-CM | POA: Diagnosis not present

## 2021-11-05 DIAGNOSIS — C44519 Basal cell carcinoma of skin of other part of trunk: Secondary | ICD-10-CM | POA: Diagnosis not present

## 2021-11-05 DIAGNOSIS — D225 Melanocytic nevi of trunk: Secondary | ICD-10-CM | POA: Diagnosis not present

## 2021-11-05 DIAGNOSIS — C44712 Basal cell carcinoma of skin of right lower limb, including hip: Secondary | ICD-10-CM | POA: Diagnosis not present

## 2021-11-05 DIAGNOSIS — Z85820 Personal history of malignant melanoma of skin: Secondary | ICD-10-CM | POA: Diagnosis not present

## 2021-11-05 DIAGNOSIS — L821 Other seborrheic keratosis: Secondary | ICD-10-CM | POA: Diagnosis not present

## 2021-11-14 DIAGNOSIS — M79671 Pain in right foot: Secondary | ICD-10-CM | POA: Diagnosis not present

## 2021-11-14 DIAGNOSIS — M79672 Pain in left foot: Secondary | ICD-10-CM | POA: Diagnosis not present

## 2021-12-17 DIAGNOSIS — R351 Nocturia: Secondary | ICD-10-CM | POA: Diagnosis not present

## 2021-12-17 DIAGNOSIS — C61 Malignant neoplasm of prostate: Secondary | ICD-10-CM | POA: Diagnosis not present

## 2021-12-17 DIAGNOSIS — N401 Enlarged prostate with lower urinary tract symptoms: Secondary | ICD-10-CM | POA: Diagnosis not present

## 2021-12-27 DIAGNOSIS — I700 Atherosclerosis of aorta: Secondary | ICD-10-CM | POA: Diagnosis not present

## 2021-12-27 DIAGNOSIS — Z1331 Encounter for screening for depression: Secondary | ICD-10-CM | POA: Diagnosis not present

## 2021-12-27 DIAGNOSIS — K7460 Unspecified cirrhosis of liver: Secondary | ICD-10-CM | POA: Diagnosis not present

## 2021-12-27 DIAGNOSIS — Z23 Encounter for immunization: Secondary | ICD-10-CM | POA: Diagnosis not present

## 2021-12-27 DIAGNOSIS — M519 Unspecified thoracic, thoracolumbar and lumbosacral intervertebral disc disorder: Secondary | ICD-10-CM | POA: Diagnosis not present

## 2021-12-27 DIAGNOSIS — C61 Malignant neoplasm of prostate: Secondary | ICD-10-CM | POA: Diagnosis not present

## 2021-12-27 DIAGNOSIS — D696 Thrombocytopenia, unspecified: Secondary | ICD-10-CM | POA: Diagnosis not present

## 2021-12-27 DIAGNOSIS — E1140 Type 2 diabetes mellitus with diabetic neuropathy, unspecified: Secondary | ICD-10-CM | POA: Diagnosis not present

## 2021-12-27 DIAGNOSIS — I10 Essential (primary) hypertension: Secondary | ICD-10-CM | POA: Diagnosis not present

## 2021-12-27 DIAGNOSIS — R29898 Other symptoms and signs involving the musculoskeletal system: Secondary | ICD-10-CM | POA: Diagnosis not present

## 2021-12-27 DIAGNOSIS — Z0000 Encounter for general adult medical examination without abnormal findings: Secondary | ICD-10-CM | POA: Diagnosis not present

## 2021-12-27 DIAGNOSIS — K219 Gastro-esophageal reflux disease without esophagitis: Secondary | ICD-10-CM | POA: Diagnosis not present

## 2022-01-14 DIAGNOSIS — I10 Essential (primary) hypertension: Secondary | ICD-10-CM | POA: Diagnosis not present

## 2022-04-22 DIAGNOSIS — I10 Essential (primary) hypertension: Secondary | ICD-10-CM | POA: Diagnosis not present

## 2022-04-22 DIAGNOSIS — K7460 Unspecified cirrhosis of liver: Secondary | ICD-10-CM | POA: Diagnosis not present

## 2022-04-22 DIAGNOSIS — E1165 Type 2 diabetes mellitus with hyperglycemia: Secondary | ICD-10-CM | POA: Diagnosis not present

## 2022-04-22 DIAGNOSIS — E162 Hypoglycemia, unspecified: Secondary | ICD-10-CM | POA: Diagnosis not present

## 2022-04-22 DIAGNOSIS — G629 Polyneuropathy, unspecified: Secondary | ICD-10-CM | POA: Diagnosis not present

## 2022-04-22 DIAGNOSIS — E781 Pure hyperglyceridemia: Secondary | ICD-10-CM | POA: Diagnosis not present

## 2022-04-22 DIAGNOSIS — C61 Malignant neoplasm of prostate: Secondary | ICD-10-CM | POA: Diagnosis not present

## 2022-05-08 DIAGNOSIS — L814 Other melanin hyperpigmentation: Secondary | ICD-10-CM | POA: Diagnosis not present

## 2022-05-08 DIAGNOSIS — Z85828 Personal history of other malignant neoplasm of skin: Secondary | ICD-10-CM | POA: Diagnosis not present

## 2022-05-08 DIAGNOSIS — L720 Epidermal cyst: Secondary | ICD-10-CM | POA: Diagnosis not present

## 2022-05-08 DIAGNOSIS — L821 Other seborrheic keratosis: Secondary | ICD-10-CM | POA: Diagnosis not present

## 2022-05-08 DIAGNOSIS — D485 Neoplasm of uncertain behavior of skin: Secondary | ICD-10-CM | POA: Diagnosis not present

## 2022-05-08 DIAGNOSIS — L570 Actinic keratosis: Secondary | ICD-10-CM | POA: Diagnosis not present

## 2022-05-08 DIAGNOSIS — L820 Inflamed seborrheic keratosis: Secondary | ICD-10-CM | POA: Diagnosis not present

## 2022-05-08 DIAGNOSIS — Z85820 Personal history of malignant melanoma of skin: Secondary | ICD-10-CM | POA: Diagnosis not present

## 2022-05-08 DIAGNOSIS — D1801 Hemangioma of skin and subcutaneous tissue: Secondary | ICD-10-CM | POA: Diagnosis not present

## 2022-05-14 DIAGNOSIS — M25512 Pain in left shoulder: Secondary | ICD-10-CM | POA: Diagnosis not present

## 2022-05-14 DIAGNOSIS — M25511 Pain in right shoulder: Secondary | ICD-10-CM | POA: Diagnosis not present

## 2022-05-21 ENCOUNTER — Ambulatory Visit
Admission: RE | Admit: 2022-05-21 | Discharge: 2022-05-21 | Disposition: A | Discharge status: Home / Self Care | Payer: HMO | Source: Ambulatory Visit | Attending: Internal Medicine | Admitting: Internal Medicine

## 2022-05-21 ENCOUNTER — Other Ambulatory Visit: Payer: Self-pay | Admitting: Internal Medicine

## 2022-05-21 DIAGNOSIS — R140 Abdominal distension (gaseous): Secondary | ICD-10-CM | POA: Diagnosis not present

## 2022-05-21 DIAGNOSIS — R197 Diarrhea, unspecified: Secondary | ICD-10-CM

## 2022-05-22 DIAGNOSIS — R197 Diarrhea, unspecified: Secondary | ICD-10-CM | POA: Diagnosis not present

## 2022-05-29 DIAGNOSIS — K7460 Unspecified cirrhosis of liver: Secondary | ICD-10-CM | POA: Diagnosis not present

## 2022-05-29 DIAGNOSIS — E6609 Other obesity due to excess calories: Secondary | ICD-10-CM | POA: Diagnosis not present

## 2022-05-29 DIAGNOSIS — R197 Diarrhea, unspecified: Secondary | ICD-10-CM | POA: Diagnosis not present

## 2022-06-03 DIAGNOSIS — E1165 Type 2 diabetes mellitus with hyperglycemia: Secondary | ICD-10-CM | POA: Diagnosis not present

## 2022-06-18 DIAGNOSIS — C61 Malignant neoplasm of prostate: Secondary | ICD-10-CM | POA: Diagnosis not present

## 2022-06-26 DIAGNOSIS — N182 Chronic kidney disease, stage 2 (mild): Secondary | ICD-10-CM | POA: Diagnosis not present

## 2022-06-26 DIAGNOSIS — D696 Thrombocytopenia, unspecified: Secondary | ICD-10-CM | POA: Diagnosis not present

## 2022-06-26 DIAGNOSIS — R197 Diarrhea, unspecified: Secondary | ICD-10-CM | POA: Diagnosis not present

## 2022-06-26 DIAGNOSIS — Z794 Long term (current) use of insulin: Secondary | ICD-10-CM | POA: Diagnosis not present

## 2022-06-26 DIAGNOSIS — Z23 Encounter for immunization: Secondary | ICD-10-CM | POA: Diagnosis not present

## 2022-06-26 DIAGNOSIS — I700 Atherosclerosis of aorta: Secondary | ICD-10-CM | POA: Diagnosis not present

## 2022-06-26 DIAGNOSIS — E1136 Type 2 diabetes mellitus with diabetic cataract: Secondary | ICD-10-CM | POA: Diagnosis not present

## 2022-06-26 DIAGNOSIS — E1142 Type 2 diabetes mellitus with diabetic polyneuropathy: Secondary | ICD-10-CM | POA: Diagnosis not present

## 2022-06-26 DIAGNOSIS — K7460 Unspecified cirrhosis of liver: Secondary | ICD-10-CM | POA: Diagnosis not present

## 2022-06-27 ENCOUNTER — Other Ambulatory Visit: Payer: Self-pay | Admitting: Gastroenterology

## 2022-06-27 DIAGNOSIS — Z8719 Personal history of other diseases of the digestive system: Secondary | ICD-10-CM

## 2022-07-01 DIAGNOSIS — H4911 Fourth [trochlear] nerve palsy, right eye: Secondary | ICD-10-CM | POA: Diagnosis not present

## 2022-07-01 DIAGNOSIS — H353131 Nonexudative age-related macular degeneration, bilateral, early dry stage: Secondary | ICD-10-CM | POA: Diagnosis not present

## 2022-07-01 DIAGNOSIS — H2513 Age-related nuclear cataract, bilateral: Secondary | ICD-10-CM | POA: Diagnosis not present

## 2022-07-01 DIAGNOSIS — E119 Type 2 diabetes mellitus without complications: Secondary | ICD-10-CM | POA: Diagnosis not present

## 2022-07-29 ENCOUNTER — Ambulatory Visit
Admission: RE | Admit: 2022-07-29 | Discharge: 2022-07-29 | Disposition: A | Discharge status: Home / Self Care | Payer: PPO | Source: Ambulatory Visit | Attending: Gastroenterology | Admitting: Gastroenterology

## 2022-07-29 DIAGNOSIS — K8020 Calculus of gallbladder without cholecystitis without obstruction: Secondary | ICD-10-CM | POA: Diagnosis not present

## 2022-07-29 DIAGNOSIS — Z8719 Personal history of other diseases of the digestive system: Secondary | ICD-10-CM

## 2022-07-29 DIAGNOSIS — K7460 Unspecified cirrhosis of liver: Secondary | ICD-10-CM | POA: Diagnosis not present

## 2022-08-01 ENCOUNTER — Other Ambulatory Visit: Payer: Self-pay | Admitting: Gastroenterology

## 2022-08-01 DIAGNOSIS — R935 Abnormal findings on diagnostic imaging of other abdominal regions, including retroperitoneum: Secondary | ICD-10-CM

## 2022-08-01 DIAGNOSIS — K769 Liver disease, unspecified: Secondary | ICD-10-CM

## 2022-08-02 ENCOUNTER — Encounter: Payer: Self-pay | Admitting: Gastroenterology

## 2022-08-30 ENCOUNTER — Ambulatory Visit
Admission: RE | Admit: 2022-08-30 | Discharge: 2022-08-30 | Disposition: A | Discharge status: Home / Self Care | Payer: PPO | Source: Ambulatory Visit | Attending: Gastroenterology | Admitting: Gastroenterology

## 2022-08-30 DIAGNOSIS — K7689 Other specified diseases of liver: Secondary | ICD-10-CM | POA: Diagnosis not present

## 2022-08-30 DIAGNOSIS — K760 Fatty (change of) liver, not elsewhere classified: Secondary | ICD-10-CM | POA: Diagnosis not present

## 2022-08-30 DIAGNOSIS — R935 Abnormal findings on diagnostic imaging of other abdominal regions, including retroperitoneum: Secondary | ICD-10-CM

## 2022-08-30 DIAGNOSIS — K769 Liver disease, unspecified: Secondary | ICD-10-CM

## 2022-08-30 MED ORDER — GADOPICLENOL 0.5 MMOL/ML IV SOLN
10.0000 mL | Freq: Once | INTRAVENOUS | Status: AC | PRN
  Administered 2022-08-30: 10 mL via INTRAVENOUS

## 2022-09-12 DIAGNOSIS — K7689 Other specified diseases of liver: Secondary | ICD-10-CM | POA: Diagnosis not present

## 2022-09-12 DIAGNOSIS — R82998 Other abnormal findings in urine: Secondary | ICD-10-CM | POA: Diagnosis not present

## 2022-10-03 DIAGNOSIS — E162 Hypoglycemia, unspecified: Secondary | ICD-10-CM | POA: Diagnosis not present

## 2022-10-03 DIAGNOSIS — E1165 Type 2 diabetes mellitus with hyperglycemia: Secondary | ICD-10-CM | POA: Diagnosis not present

## 2022-10-03 DIAGNOSIS — K7460 Unspecified cirrhosis of liver: Secondary | ICD-10-CM | POA: Diagnosis not present

## 2022-10-03 DIAGNOSIS — G629 Polyneuropathy, unspecified: Secondary | ICD-10-CM | POA: Diagnosis not present

## 2022-10-03 DIAGNOSIS — E781 Pure hyperglyceridemia: Secondary | ICD-10-CM | POA: Diagnosis not present

## 2022-10-03 DIAGNOSIS — C61 Malignant neoplasm of prostate: Secondary | ICD-10-CM | POA: Diagnosis not present

## 2022-10-03 DIAGNOSIS — I10 Essential (primary) hypertension: Secondary | ICD-10-CM | POA: Diagnosis not present

## 2022-11-07 DIAGNOSIS — E162 Hypoglycemia, unspecified: Secondary | ICD-10-CM | POA: Diagnosis not present

## 2022-11-07 DIAGNOSIS — G629 Polyneuropathy, unspecified: Secondary | ICD-10-CM | POA: Diagnosis not present

## 2022-11-07 DIAGNOSIS — C61 Malignant neoplasm of prostate: Secondary | ICD-10-CM | POA: Diagnosis not present

## 2022-11-07 DIAGNOSIS — I10 Essential (primary) hypertension: Secondary | ICD-10-CM | POA: Diagnosis not present

## 2022-11-07 DIAGNOSIS — K7460 Unspecified cirrhosis of liver: Secondary | ICD-10-CM | POA: Diagnosis not present

## 2022-11-07 DIAGNOSIS — E1165 Type 2 diabetes mellitus with hyperglycemia: Secondary | ICD-10-CM | POA: Diagnosis not present

## 2022-11-07 DIAGNOSIS — E781 Pure hyperglyceridemia: Secondary | ICD-10-CM | POA: Diagnosis not present

## 2022-11-12 DIAGNOSIS — D225 Melanocytic nevi of trunk: Secondary | ICD-10-CM | POA: Diagnosis not present

## 2022-11-12 DIAGNOSIS — L821 Other seborrheic keratosis: Secondary | ICD-10-CM | POA: Diagnosis not present

## 2022-11-12 DIAGNOSIS — S20412A Abrasion of left back wall of thorax, initial encounter: Secondary | ICD-10-CM | POA: Diagnosis not present

## 2022-11-12 DIAGNOSIS — Z85828 Personal history of other malignant neoplasm of skin: Secondary | ICD-10-CM | POA: Diagnosis not present

## 2022-11-12 DIAGNOSIS — L570 Actinic keratosis: Secondary | ICD-10-CM | POA: Diagnosis not present

## 2022-11-12 DIAGNOSIS — Z85820 Personal history of malignant melanoma of skin: Secondary | ICD-10-CM | POA: Diagnosis not present

## 2022-11-12 DIAGNOSIS — L814 Other melanin hyperpigmentation: Secondary | ICD-10-CM | POA: Diagnosis not present

## 2022-11-29 DIAGNOSIS — Z993 Dependence on wheelchair: Secondary | ICD-10-CM | POA: Diagnosis not present

## 2022-11-29 DIAGNOSIS — Z9089 Acquired absence of other organs: Secondary | ICD-10-CM | POA: Diagnosis not present

## 2022-11-29 DIAGNOSIS — Z79899 Other long term (current) drug therapy: Secondary | ICD-10-CM | POA: Diagnosis not present

## 2022-11-29 DIAGNOSIS — H903 Sensorineural hearing loss, bilateral: Secondary | ICD-10-CM | POA: Diagnosis not present

## 2022-11-29 DIAGNOSIS — Z77122 Contact with and (suspected) exposure to noise: Secondary | ICD-10-CM | POA: Diagnosis not present

## 2022-11-29 DIAGNOSIS — H9313 Tinnitus, bilateral: Secondary | ICD-10-CM | POA: Diagnosis not present

## 2022-11-29 DIAGNOSIS — Z570 Occupational exposure to noise: Secondary | ICD-10-CM | POA: Diagnosis not present

## 2022-11-29 DIAGNOSIS — G1221 Amyotrophic lateral sclerosis: Secondary | ICD-10-CM | POA: Diagnosis not present

## 2022-11-29 DIAGNOSIS — Z0110 Encounter for examination of ears and hearing without abnormal findings: Secondary | ICD-10-CM | POA: Diagnosis not present

## 2022-12-25 DIAGNOSIS — E1165 Type 2 diabetes mellitus with hyperglycemia: Secondary | ICD-10-CM | POA: Diagnosis not present

## 2022-12-25 DIAGNOSIS — K7460 Unspecified cirrhosis of liver: Secondary | ICD-10-CM | POA: Diagnosis not present

## 2022-12-25 DIAGNOSIS — E6609 Other obesity due to excess calories: Secondary | ICD-10-CM | POA: Diagnosis not present

## 2022-12-26 DIAGNOSIS — K7460 Unspecified cirrhosis of liver: Secondary | ICD-10-CM | POA: Diagnosis not present

## 2022-12-26 DIAGNOSIS — C61 Malignant neoplasm of prostate: Secondary | ICD-10-CM | POA: Diagnosis not present

## 2022-12-30 DIAGNOSIS — E1140 Type 2 diabetes mellitus with diabetic neuropathy, unspecified: Secondary | ICD-10-CM | POA: Diagnosis not present

## 2022-12-30 DIAGNOSIS — E785 Hyperlipidemia, unspecified: Secondary | ICD-10-CM | POA: Diagnosis not present

## 2022-12-30 DIAGNOSIS — K7460 Unspecified cirrhosis of liver: Secondary | ICD-10-CM | POA: Diagnosis not present

## 2022-12-30 DIAGNOSIS — M519 Unspecified thoracic, thoracolumbar and lumbosacral intervertebral disc disorder: Secondary | ICD-10-CM | POA: Diagnosis not present

## 2022-12-30 DIAGNOSIS — N289 Disorder of kidney and ureter, unspecified: Secondary | ICD-10-CM | POA: Diagnosis not present

## 2022-12-30 DIAGNOSIS — Z23 Encounter for immunization: Secondary | ICD-10-CM | POA: Diagnosis not present

## 2022-12-30 DIAGNOSIS — D696 Thrombocytopenia, unspecified: Secondary | ICD-10-CM | POA: Diagnosis not present

## 2022-12-30 DIAGNOSIS — N182 Chronic kidney disease, stage 2 (mild): Secondary | ICD-10-CM | POA: Diagnosis not present

## 2022-12-30 DIAGNOSIS — Z8546 Personal history of malignant neoplasm of prostate: Secondary | ICD-10-CM | POA: Diagnosis not present

## 2022-12-30 DIAGNOSIS — Z1331 Encounter for screening for depression: Secondary | ICD-10-CM | POA: Diagnosis not present

## 2022-12-30 DIAGNOSIS — I700 Atherosclerosis of aorta: Secondary | ICD-10-CM | POA: Diagnosis not present

## 2022-12-30 DIAGNOSIS — Z0000 Encounter for general adult medical examination without abnormal findings: Secondary | ICD-10-CM | POA: Diagnosis not present

## 2022-12-30 DIAGNOSIS — Z794 Long term (current) use of insulin: Secondary | ICD-10-CM | POA: Diagnosis not present

## 2022-12-30 DIAGNOSIS — I10 Essential (primary) hypertension: Secondary | ICD-10-CM | POA: Diagnosis not present

## 2022-12-31 ENCOUNTER — Other Ambulatory Visit: Payer: Self-pay | Admitting: Internal Medicine

## 2022-12-31 DIAGNOSIS — N289 Disorder of kidney and ureter, unspecified: Secondary | ICD-10-CM

## 2023-01-01 DIAGNOSIS — R262 Difficulty in walking, not elsewhere classified: Secondary | ICD-10-CM | POA: Diagnosis not present

## 2023-01-02 DIAGNOSIS — D49511 Neoplasm of unspecified behavior of right kidney: Secondary | ICD-10-CM | POA: Diagnosis not present

## 2023-01-02 DIAGNOSIS — R351 Nocturia: Secondary | ICD-10-CM | POA: Diagnosis not present

## 2023-01-02 DIAGNOSIS — C61 Malignant neoplasm of prostate: Secondary | ICD-10-CM | POA: Diagnosis not present

## 2023-01-02 DIAGNOSIS — N401 Enlarged prostate with lower urinary tract symptoms: Secondary | ICD-10-CM | POA: Diagnosis not present

## 2023-01-08 DIAGNOSIS — R262 Difficulty in walking, not elsewhere classified: Secondary | ICD-10-CM | POA: Diagnosis not present

## 2023-01-13 DIAGNOSIS — R262 Difficulty in walking, not elsewhere classified: Secondary | ICD-10-CM | POA: Diagnosis not present

## 2023-01-15 DIAGNOSIS — R262 Difficulty in walking, not elsewhere classified: Secondary | ICD-10-CM | POA: Diagnosis not present

## 2023-01-27 DIAGNOSIS — R262 Difficulty in walking, not elsewhere classified: Secondary | ICD-10-CM | POA: Diagnosis not present

## 2023-01-29 DIAGNOSIS — R262 Difficulty in walking, not elsewhere classified: Secondary | ICD-10-CM | POA: Diagnosis not present

## 2023-02-03 DIAGNOSIS — R262 Difficulty in walking, not elsewhere classified: Secondary | ICD-10-CM | POA: Diagnosis not present

## 2023-02-04 DIAGNOSIS — G629 Polyneuropathy, unspecified: Secondary | ICD-10-CM | POA: Diagnosis not present

## 2023-02-04 DIAGNOSIS — E781 Pure hyperglyceridemia: Secondary | ICD-10-CM | POA: Diagnosis not present

## 2023-02-04 DIAGNOSIS — E1165 Type 2 diabetes mellitus with hyperglycemia: Secondary | ICD-10-CM | POA: Diagnosis not present

## 2023-02-05 DIAGNOSIS — R262 Difficulty in walking, not elsewhere classified: Secondary | ICD-10-CM | POA: Diagnosis not present

## 2023-02-10 DIAGNOSIS — R262 Difficulty in walking, not elsewhere classified: Secondary | ICD-10-CM | POA: Diagnosis not present

## 2023-02-11 DIAGNOSIS — I10 Essential (primary) hypertension: Secondary | ICD-10-CM | POA: Diagnosis not present

## 2023-02-11 DIAGNOSIS — C61 Malignant neoplasm of prostate: Secondary | ICD-10-CM | POA: Diagnosis not present

## 2023-02-11 DIAGNOSIS — E162 Hypoglycemia, unspecified: Secondary | ICD-10-CM | POA: Diagnosis not present

## 2023-02-11 DIAGNOSIS — E1165 Type 2 diabetes mellitus with hyperglycemia: Secondary | ICD-10-CM | POA: Diagnosis not present

## 2023-02-11 DIAGNOSIS — G629 Polyneuropathy, unspecified: Secondary | ICD-10-CM | POA: Diagnosis not present

## 2023-02-11 DIAGNOSIS — E781 Pure hyperglyceridemia: Secondary | ICD-10-CM | POA: Diagnosis not present

## 2023-02-11 DIAGNOSIS — K7460 Unspecified cirrhosis of liver: Secondary | ICD-10-CM | POA: Diagnosis not present

## 2023-02-12 DIAGNOSIS — R262 Difficulty in walking, not elsewhere classified: Secondary | ICD-10-CM | POA: Diagnosis not present

## 2023-02-17 DIAGNOSIS — R262 Difficulty in walking, not elsewhere classified: Secondary | ICD-10-CM | POA: Diagnosis not present

## 2023-02-19 DIAGNOSIS — R262 Difficulty in walking, not elsewhere classified: Secondary | ICD-10-CM | POA: Diagnosis not present

## 2023-03-03 DIAGNOSIS — R262 Difficulty in walking, not elsewhere classified: Secondary | ICD-10-CM | POA: Diagnosis not present

## 2023-03-04 DIAGNOSIS — Z131 Encounter for screening for diabetes mellitus: Secondary | ICD-10-CM | POA: Diagnosis not present

## 2023-03-05 DIAGNOSIS — R262 Difficulty in walking, not elsewhere classified: Secondary | ICD-10-CM | POA: Diagnosis not present

## 2023-03-10 DIAGNOSIS — R262 Difficulty in walking, not elsewhere classified: Secondary | ICD-10-CM | POA: Diagnosis not present

## 2023-03-12 DIAGNOSIS — R262 Difficulty in walking, not elsewhere classified: Secondary | ICD-10-CM | POA: Diagnosis not present

## 2023-03-13 ENCOUNTER — Ambulatory Visit
Admission: RE | Admit: 2023-03-13 | Discharge: 2023-03-13 | Disposition: A | Discharge status: Home / Self Care | Payer: PPO | Source: Ambulatory Visit | Attending: Internal Medicine | Admitting: Internal Medicine

## 2023-03-13 DIAGNOSIS — K760 Fatty (change of) liver, not elsewhere classified: Secondary | ICD-10-CM | POA: Diagnosis not present

## 2023-03-13 DIAGNOSIS — R161 Splenomegaly, not elsewhere classified: Secondary | ICD-10-CM | POA: Diagnosis not present

## 2023-03-13 DIAGNOSIS — N289 Disorder of kidney and ureter, unspecified: Secondary | ICD-10-CM

## 2023-03-13 DIAGNOSIS — C641 Malignant neoplasm of right kidney, except renal pelvis: Secondary | ICD-10-CM | POA: Diagnosis not present

## 2023-03-13 MED ORDER — GADOPICLENOL 0.5 MMOL/ML IV SOLN
10.0000 mL | Freq: Once | INTRAVENOUS | Status: AC | PRN
Start: 2023-03-13 — End: 2023-03-13
  Administered 2023-03-13: 10 mL via INTRAVENOUS

## 2023-03-24 DIAGNOSIS — R262 Difficulty in walking, not elsewhere classified: Secondary | ICD-10-CM | POA: Diagnosis not present

## 2023-03-25 DIAGNOSIS — H903 Sensorineural hearing loss, bilateral: Secondary | ICD-10-CM | POA: Diagnosis not present

## 2023-03-26 DIAGNOSIS — R262 Difficulty in walking, not elsewhere classified: Secondary | ICD-10-CM | POA: Diagnosis not present

## 2023-03-27 DIAGNOSIS — D49511 Neoplasm of unspecified behavior of right kidney: Secondary | ICD-10-CM | POA: Diagnosis not present

## 2023-03-27 DIAGNOSIS — C61 Malignant neoplasm of prostate: Secondary | ICD-10-CM | POA: Diagnosis not present

## 2023-03-28 DIAGNOSIS — E1165 Type 2 diabetes mellitus with hyperglycemia: Secondary | ICD-10-CM | POA: Diagnosis not present

## 2023-03-31 DIAGNOSIS — R262 Difficulty in walking, not elsewhere classified: Secondary | ICD-10-CM | POA: Diagnosis not present

## 2023-04-03 ENCOUNTER — Other Ambulatory Visit: Payer: Self-pay | Admitting: Urology

## 2023-04-03 ENCOUNTER — Encounter: Payer: Self-pay | Admitting: Urology

## 2023-04-03 DIAGNOSIS — D49511 Neoplasm of unspecified behavior of right kidney: Secondary | ICD-10-CM

## 2023-04-07 DIAGNOSIS — R262 Difficulty in walking, not elsewhere classified: Secondary | ICD-10-CM | POA: Diagnosis not present

## 2023-04-14 ENCOUNTER — Inpatient Hospital Stay
Admission: RE | Admit: 2023-04-14 | Discharge: 2023-04-14 | Disposition: A | Discharge status: Home / Self Care | Payer: PPO | Source: Ambulatory Visit | Attending: Urology | Admitting: Urology

## 2023-04-16 ENCOUNTER — Ambulatory Visit
Admission: RE | Admit: 2023-04-16 | Discharge: 2023-04-16 | Disposition: A | Discharge status: Home / Self Care | Payer: PPO | Source: Ambulatory Visit | Attending: Urology | Admitting: Urology

## 2023-04-16 DIAGNOSIS — D49511 Neoplasm of unspecified behavior of right kidney: Secondary | ICD-10-CM

## 2023-04-16 NOTE — Consult Note (Signed)
 Chief Complaint: Patient was seen in consultation today for right renal cystic neoplasm  at the request of Winter,Christopher Beverley  Referring Physician(s): Winter,Christopher Beverley  History of Present Illness: Albert Andrews is a 73 y.o. male history of prostate cancer post brachitherapy, noted to have enlarging cystic lesion at lower pole right kidney. MRI demonstrates enhancing solid component. No regional adenopathy, venous involvement, or evidence of distal mets. He presents for discussion of treatment options.   Past Medical History:  Diagnosis Date   DM type 2 (diabetes mellitus, type 2) (HCC)    GERD (gastroesophageal reflux disease)    Hyperlipidemia    Melanoma (HCC) 08/02/2020   removed from both legs areas healed    Prostate cancer (HCC)    Urinary frequency    Weakness of both lower extremities    due to dm uses power wc    Past Surgical History:  Procedure Laterality Date   ADENOIDECTOMY, TONSILLECTOMY AND MYRINGOTOMY WITH TUBE PLACEMENT  as child   BIOPSY  01/07/2020   Procedure: BIOPSY;  Surgeon: Rollin Dover, MD;  Location: [facility];  Service: Endoscopy;;   COLONOSCOPY WITH PROPOFOL  N/A 01/26/2021   Procedure: COLONOSCOPY WITH PROPOFOL ;  Surgeon: Rollin Dover, MD;  Location: [facility];  Service: Endoscopy;  Laterality: N/A;   ESOPHAGOGASTRODUODENOSCOPY (EGD) WITH PROPOFOL  N/A 01/07/2020   Procedure: ESOPHAGOGASTRODUODENOSCOPY (EGD) WITH PROPOFOL ;  Surgeon: Rollin Dover, MD;  Location: [facility];  Service: Endoscopy;  Laterality: N/A;   POLYPECTOMY  01/26/2021   Procedure: POLYPECTOMY;  Surgeon: Rollin Dover, MD;  Location: [facility];  Service: Endoscopy;;   PROSTATE BIOPSY     RADIOACTIVE SEED IMPLANT N/A 09/01/2020   Procedure: RADIOACTIVE SEED IMPLANT/BRACHYTHERAPY IMPLANT;  Surgeon: Devere Lonni Beverley, MD;  Location: [facility];  Service: Urology;  Laterality: N/A;   ROTATOR CUFF REPAIR Left 1994   SPACE OAR  INSTILLATION N/A 09/01/2020   Procedure: SPACE OAR INSTILLATION;  Surgeon: Devere Lonni Beverley, MD;  Location: [place];  Service: Urology;  Laterality: N/A;    Allergies: Jardiance [empagliflozin] and Metformin and related  Medications: Prior to Admission medications   Medication Sig Start Date End Date Taking? Authorizing Provider  Blood Glucose Monitoring Suppl (FREESTYLE FREEDOM LITE) w/Device KIT USE TO CHECK GLUCOSE ONCE DAILY 05/02/18   [provider]  Dulaglutide (TRULICITY) 1.5 MG/0.5ML SOPN Inject 1.5 mg into the skin every Sunday.    [provider]  fexofenadine (ALLEGRA) 180 MG tablet Take 180 mg by mouth at bedtime.    [provider]  Insulin  Degludec (TRESIBA) 100 UNIT/ML SOLN Inject 50-75 Units into the skin See admin instructions. Inject 75 units in am and 50 units at bedtime    [provider]  losartan (COZAAR) 25 MG tablet Take 25 mg by mouth at bedtime.    [provider]  meclizine (ANTIVERT) 25 MG tablet Take 25 mg by mouth 2 (two) times daily as needed for dizziness.    [provider]  mirtazapine (REMERON) 15 MG tablet Take 15 mg by mouth at bedtime.    [provider]  NOVOLOG  FLEXPEN 100 UNIT/ML FlexPen Inject 10-16 Units into the skin 3 (three) times daily before meals. 03/12/20   [provider]  omeprazole (PRILOSEC) 40 MG capsule Take 40 mg by mouth 2 (two) times daily.    [provider]  OVER THE COUNTER MEDICATION Take 3 tablets by mouth daily. balance of nature fruit    [provider]  OVER  THE COUNTER MEDICATION Take 3 tablets by mouth daily. balance of nature vegetables    [provider]  pregabalin (LYRICA) 100 MG capsule Take 100 mg by mouth 2 (two) times daily. 01/21/20 01/23/21  [provider]  simvastatin (ZOCOR) 10 MG tablet Take 10 mg by mouth every evening.     [provider]  tamsulosin (FLOMAX) 0.4 MG  CAPS capsule Take 0.4 mg by mouth 2 (two) times daily.    [provider]     Family History  Problem Relation Age of Onset   Cancer Brother        unknown   Prostate cancer Neg Hx     Social History   Socioeconomic History   Marital status: Married    Spouse name: Not on file   Number of children: Not on file   Years of education: Not on file   Highest education level: Not on file  Occupational History    Comment: retired  Tobacco Use   Smoking status: Never   Smokeless tobacco: Never  Vaping Use   Vaping status: Never Used  Substance and Sexual Activity   Alcohol  use: Not Currently   Drug use: No   Sexual activity: Not Currently  Other Topics Concern   Not on file  Social History Narrative   Not on file   Social Drivers of Health   Financial Resource Strain: Not on file  Food Insecurity: No Food Insecurity (03/28/2020)   Hunger Vital Sign    Worried About Running Out of Food in the Last Year: Never true    Ran Out of Food in the Last Year: Never true  Transportation Needs: Not on file  Physical Activity: Not on file  Stress: Not on file  Social Connections: Not on file    ECOG Status: 0 - Asymptomatic  Review of Systems: A 12 point ROS discussed and pertinent positives are indicated in the HPI above.  All other systems are negative.  Review of Systems  Vital Signs: BP 127/70 (BP Location: Left Arm)   Pulse 73   Temp 97.8 F (36.6 C) (Oral)   Resp 16   SpO2 91%     Physical Exam Constitutional: Oriented to person, place, and time. Well-developed and well-nourished. No distress.   HENT:  Head: Normocephalic and atraumatic.  Eyes: Conjunctivae and EOM are normal. Right eye exhibits no discharge. Left eye exhibits no discharge. No scleral icterus.  Neck: No JVD present.  Pulmonary/Chest: Effort normal. No stridor. No respiratory distress.  Abdomen: soft, non distended Neurological:  alert and oriented to person, place, and time.  Skin:  Skin is warm and dry.  not diaphoretic.  Psychiatric:   normal mood and affect.   behavior is normal. Judgment and thought content normal.     Imaging: MRI ABDOMEN WITHOUT AND WITH CONTRAST   TECHNIQUE: Multiplanar multisequence MR imaging of the abdomen was performed both before and after the administration of intravenous contrast.   CONTRAST:  10 mL Vueway  gadolinium contrast IV   COMPARISON:  08/30/2022   FINDINGS: Lower chest: No acute abnormality.   Hepatobiliary: No solid liver abnormality is seen. Simple benign cyst of the anterior liver dome, for which no further follow-up or characterization is required. Hepatic steatosis. Coarse contour of the liver as well as heterogeneous background parenchymal enhancement. No gallstones, gallbladder wall thickening, or biliary dilatation.   Pancreas: Unremarkable. No pancreatic ductal dilatation or surrounding inflammatory changes.   Spleen: Splenomegaly, maximum coronal span 18.1 cm.  Adrenals/Urinary Tract: Adrenal glands are unremarkable. Unchanged mixed solid and cystic mass of the posterior inferior pole of the right kidney measuring in total 2.0 x 1.8 cm, including an enhancing internal solid nodular component measuring 1.2 x 0.6 cm (series 11, image 75). Simple benign cortical cyst of the superior pole of the left kidney, for which no further follow-up or characterization is required. The left kidney is otherwise normal, without renal calculi, solid lesion, or hydronephrosis.   Stomach/Bowel: Stomach is within normal limits. No evidence of bowel wall thickening, distention, or inflammatory changes.   Vascular/Lymphatic: Aortic atherosclerosis. No enlarged abdominal lymph nodes.   Other: No abdominal wall hernia or abnormality. No ascites.   Musculoskeletal: No acute or significant osseous findings.   IMPRESSION: 1. Unchanged mixed solid and cystic mass of the posterior inferior pole of the right kidney measuring  in total 2.0 x 1.8 cm, including an enhancing internal solid nodular component measuring 1.2 x 0.6 cm. 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 2. No evidence of renal vein invasion, lymphadenopathy, or metastatic disease in the abdomen. 3. Hepatic steatosis. Coarse contour of the liver as well as heterogeneous background parenchymal enhancement, suggestive of cirrhosis. 4. Splenomegaly.   Aortic Atherosclerosis (ICD10-I70.0).     Electronically Signed   By: Marolyn JONETTA Jaksch M.D.   On: 03/15/2023 08:00  Labs:  CBC:          Component Ref Range & Units (hover) 2 yr ago (02/07/21) 2 yr ago (02/07/21) 2 yr ago (08/29/20) 9 yr ago (07/31/13) 10 yr ago (05/07/12) 11 yr ago (03/17/12) 11 yr ago (10/08/11)  WBC 6.1  7.8 5.9 R 12.2 Abnormal  R 7.8 8.3  RBC 4.95  4.72 4.14 Abnormal  R 5.46 R 5.25 4.79  Hemoglobin 15.5  14.9 12.8 Abnormal  R 17.3 R 16.8 15.3  HCT 45.4  43.2 39.2 Abnormal  R 52.4 R 47.4 42.9  MCV 91.7  91.5 94.8 R 95.9 R 90.3 R 89.6 R  MCH 31.3  31.6 30.9 R 31.7 Abnormal  R 32.0 31.9  MCHC 34.1  34.5 32.7 R 33.0 R 35.4 35.7  RDW 13.6  13.6   13.5 13.3  Platelets 102 Low  102 Low  CM 122 Low  CM   175 15    COAGS: No results for input(s): INR, APTT in the last 8760 hours.  BMP:         Component Ref Range & Units (hover) 2 yr ago (02/07/21) 2 yr ago (08/29/20) 9 yr ago (07/31/13) 10 yr ago (05/07/12) 11 yr ago (03/17/12) 11 yr ago (10/08/11)  Sodium 142 139 139 R 140 R 138 R 140 R  Potassium 3.8 3.8 4.0 R 4.2 R 3.8 R 3.7 R  Chloride 105 106 103 R 104 R 102 R 105 R  CO2 27 25 26  R 26 R 28 R 24 R  Glucose, Bld 118 High  246 High  CM 149 High  114 High  92 117 High   Comment: Glucose reference range applies only to samples taken after fasting for at least 8 hours.  BUN 12 15 23  R 21 R 18 R 21 R  Creatinine 0.94 0.76 1.00 R 1.43 High  R 1.02 R 0.98 R  Calcium  9.4 8.9 9.1 R 10.0 R 9.4 R 9.2 R  Total Protein 8.3 High  7.6 7.5  R  7.3 R 7.2 R  Albumin 4.1 4.0 4.6 R  4.7 R 4.3 R  AST 75 High  83 High  58 High  R  58 High  R 50 High  R  ALT 60 High  73 High  79 High  R  78 High  R 61 High  R  Alkaline Phosphatase 99 86 81 R  61 R 58 R  Total Bilirubin 0.8 0.5 0.9 R  0.6 0.6  GFR, Estimated >60       Comment: (NOTE) Calculated using the CKD-EPI Creatinine Equation (2021)  Anion gap 10 8 CM      Comment: Performed at [facility], [address] [place], [place] [address]       Assessment and Plan:    My impression is that this patient has a enlarging   2cm right lower pole renal mass concerning for   renal cell carcinoma.   We discussed this in detail and in regards to the spectrum of renal masses which includes cysts (pure cysts are considered benign), solid masses and everything in between. The risk of metastasis increases as the size of solid renal mass increases. In general, it is believed that the risk of metastasis for renal masses less than 3-4 cm is small (up to approximately 5%) based mainly on large retrospective studies.  In some cases and especially in patients of older age and multiple comorbidities a surveillance approach may be appropriate.   Active surveillance could also be considered given that this lesion is less than 3 cm to help further assess growth rate, etc. The treatment of   renal masses includes:  cryoablation (percutaneous and laparoscopic) in addition to partial and complete nephrectomy (each with option of laparoscopic, robotic and open depending on appropriateness). Furthermore, nephrectomy appears to be an independent risk factor for the development of chronic kidney disease suggesting that nephron sparing approaches should be implored whenever feasible. We reviewed these options in context of the patients current situation as well as the pros and cons of each.  We had   discussion today about the risk and benefits of each. We discussed in particular the  percutaneous cryoablation procedure with CT guidance under anesthesia, anticipated benefits, possible risks and complications, expected postoperative course, likelihood of concurrent percutaneous core biopsy to get a pathologic specimen, and need for continued imaging follow-up assuming this is demonstrated to be carcinoma.  he seemed to understand and did ask appropriate questions. he had had a good discussion with Dr. Devere  already about this.  he is motivated to proceed. Accordingly, we can set pt  up for CT-guided right  renal mass cryoablation and core biopsy under anesthesia at [facility] at his  convenience.    Thank you for this interesting consult.  I greatly enjoyed meeting Albert Andrews and look forward to participating in their care.  A copy of this report was sent to the requesting provider on this date.  Electronically Signed: Dayne Bertina Guthridge 04/16/2023, 9:35 AM   I spent a total of  40 Minutes   in face to face in clinical consultation, greater than 50% of which was counseling/coordinating care for Right renal mass.

## 2023-04-18 ENCOUNTER — Other Ambulatory Visit (HOSPITAL_COMMUNITY): Payer: Self-pay | Admitting: Interventional Radiology

## 2023-04-18 DIAGNOSIS — D49511 Neoplasm of unspecified behavior of right kidney: Secondary | ICD-10-CM

## 2023-04-23 ENCOUNTER — Other Ambulatory Visit (HOSPITAL_COMMUNITY): Payer: Self-pay | Admitting: Radiology

## 2023-04-23 DIAGNOSIS — D49511 Neoplasm of unspecified behavior of right kidney: Secondary | ICD-10-CM

## 2023-04-24 NOTE — Progress Notes (Addendum)
COVID Vaccine Completed: yes  Date of COVID positive in last 90 days:  PCP - Georgann Housekeeper, MD Cardiologist - n/a  Chest x-ray - 04/25/23 Epic EKG - 04/25/23 Epic/chart Stress Test - years ago per  ECHO - n/a Cardiac Cath - n/a Pacemaker/ICD device last checked: n/a Spinal Cord Stimulator:n/a  Bowel Prep - no  Sleep Study - n/a CPAP -   Fasting Blood Sugar - 90-130 Checks Blood Sugar  freestyle libre  Last dose of GLP1 agonist-  Ozempic, takes Saturdays GLP1 instructions:  Hold 7 days before surgery. Last dose 04/19/23. Do not take 04/26/23   Last dose of SGLT-2 inhibitors-  N/A SGLT-2 instructions:  Hold 3 days before surgery    Blood Thinner Instructions: n/a Aspirin Instructions: Last Dose:  Activity level:  Can go up a flight of stairs and perform activities of daily living without stopping and without symptoms of chest pain or shortness of breath.  Able to exercise without symptoms  Unable to go up a flight of stairs without symptoms of     Anesthesia review: Platelets 74, HTN, ALS  Patient denies shortness of breath, fever, cough and chest pain at PAT appointment  Patient verbalized understanding of instructions that were given to them at the PAT appointment. Patient was also instructed that they will need to review over the PAT instructions again at home before surgery.

## 2023-04-24 NOTE — Patient Instructions (Addendum)
SURGICAL WAITING ROOM VISITATION  Patients having surgery or a procedure may have no more than 2 support people in the waiting area - these visitors may rotate.    Children under the age of 69 must have an adult with them who is not the patient.  Due to an increase in RSV and influenza rates and associated hospitalizations, children ages 70 and under may not visit patients in [facility] hospitals.  Visitors with respiratory illnesses are discouraged from visiting and should remain at home.  If the patient needs to stay at the hospital during part of their recovery, the visitor guidelines for inpatient rooms apply. Pre-op nurse will coordinate an appropriate time for 1 support person to accompany patient in pre-op.  This support person may not rotate.    Please refer to the [facility] website for the visitor guidelines for Inpatients (after your surgery is over and you are in a regular room).    Your procedure is scheduled on: 04/30/23   Report to [facility] Main Entrance    Report to admitting at 6:15 AM   Call this number if you have problems the morning of surgery [phone]   Do not eat food or drink liquids :After Midnight.          If you have questions, please contact your surgeon's office.   FOLLOW BOWEL PREP AND ANY ADDITIONAL PRE OP INSTRUCTIONS YOU RECEIVED FROM YOUR SURGEON'S OFFICE!!!     Oral Hygiene is also important to reduce your risk of infection.                                    Remember - BRUSH YOUR TEETH THE MORNING OF SURGERY WITH YOUR REGULAR TOOTHPASTE  DENTURES WILL BE REMOVED PRIOR TO SURGERY PLEASE DO NOT APPLY "Poly grip" OR ADHESIVES!!!   Stop all vitamins and herbal supplements 7 days before surgery.   Take these medicines the morning of surgery with A SIP OF WATER: Pantoprazole, Lyrica, Tamsulosin   DO NOT TAKE ANY ORAL DIABETIC MEDICATIONS DAY OF YOUR SURGERY  How to Manage Your Diabetes Before and After Surgery  Why is it  important to control my blood sugar before and after surgery? Improving blood sugar levels before and after surgery helps healing and can limit problems. A way of improving blood sugar control is eating a healthy diet by:  Eating less sugar and carbohydrates  Increasing activity/exercise  Talking with your doctor about reaching your blood sugar goals High blood sugars (greater than 180 mg/dL) can raise your risk of infections and slow your recovery, so you will need to focus on controlling your diabetes during the weeks before surgery. Make sure that the doctor who takes care of your diabetes knows about your planned surgery including the date and location.  How do I manage my blood sugar before surgery? Check your blood sugar at least 4 times a day, starting 2 days before surgery, to make sure that the level is not too high or low. Check your blood sugar the morning of your surgery when you wake up and every 2 hours until you get to the Short Stay unit. If your blood sugar is less than 70 mg/dL, you will need to treat for low blood sugar: Do not take insulin. Treat a low blood sugar (less than 70 mg/dL) with  cup of clear juice (cranberry or apple), 4 glucose tablets, OR glucose  gel. Recheck blood sugar in 15 minutes after treatment (to make sure it is greater than 70 mg/dL). If your blood sugar is not greater than 70 mg/dL on recheck, call [phone] for further instructions. Report your blood sugar to the short stay nurse when you get to Short Stay.  If you are admitted to the hospital after surgery: Your blood sugar will be checked by the staff and you will probably be given insulin after surgery (instead of oral diabetes medicines) to make sure you have good blood sugar levels. The goal for blood sugar control after surgery is 80-180 mg/dL.   WHAT DO I DO ABOUT MY DIABETES MEDICATION?  Do not take oral diabetes medicines (pills) the morning of surgery.  Hold Ozempic 04/26/23.  THE  DAY BEFORE SURGERY, take Novolog as prescribed. Take morning dose of Tresiba as prescribed ,take 50% of evening dose      THE MORNING OF SURGERY, take 50% of Tresiba. Do not take Novolog unless blood sugar is greater than 220, then take 50% of dose.  DO NOT TAKE THE FOLLOWING 7 DAYS PRIOR TO SURGERY: Ozempic, Wegovy, Rybelsus (Semaglutide), Byetta (exenatide), Bydureon (exenatide ER), Victoza, Saxenda (liraglutide), or Trulicity (dulaglutide) Mounjaro (Tirzepatide) Adlyxin (Lixisenatide), Polyethylene Glycol Loxenatide.  If your CBG is greater than 220 mg/dL, you may take  of your sliding scale  (correction) dose of insulin.  Reviewed and Endorsed by [facility] Patient Education Committee, August 2015             You may not have any metal on your body including jewelry, and body piercing             Do not wear lotions, powders, cologne, or deodorant              Men may shave face and neck.   Do not bring valuables to the hospital. [facility] IS NOT             RESPONSIBLE   FOR VALUABLES.   Contacts, glasses, dentures or bridgework may not be worn into surgery.   Bring small overnight bag day of surgery.   DO NOT BRING YOUR HOME MEDICATIONS TO THE HOSPITAL. PHARMACY WILL DISPENSE MEDICATIONS LISTED ON YOUR MEDICATION LIST TO YOU DURING YOUR ADMISSION IN THE HOSPITAL!              Please read over the following fact sheets you were given: IF YOU HAVE QUESTIONS ABOUT YOUR PRE-OP INSTRUCTIONS PLEASE CALL [phone] Contras    If you received a COVID test during your pre-op visit  it is requested that you wear a mask when out in public, stay away from anyone that may not be feeling well and notify your surgeon if you develop symptoms. If you test positive for Covid or have been in contact with anyone that has tested positive in the last 10 days please notify you surgeon.    [facility] - Preparing for Surgery Before surgery, you can play an important role.  Because skin is not  sterile, your skin needs to be as free of germs as possible.  You can reduce the number of germs on your skin by washing with CHG (chlorahexidine gluconate) soap before surgery.  CHG is an antiseptic cleaner which kills germs and bonds with the skin to continue killing germs even after washing. Please DO NOT use if you have an allergy to CHG or antibacterial soaps.  If your skin becomes reddened/irritated stop using the CHG and inform your  nurse when you arrive at Short Stay. Do not shave (including legs and underarms) for at least 48 hours prior to the first CHG shower.  You may shave your face/neck.  Please follow these instructions carefully:  1.  Shower with CHG Soap the night before surgery and the  morning of surgery.  2.  If you choose to wash your hair, wash your hair first as usual with your normal  shampoo.  3.  After you shampoo, rinse your hair and body thoroughly to remove the shampoo.                             4.  Use CHG as you would any other liquid soap.  You can apply chg directly to the skin and wash.  Gently with a scrungie or clean washcloth.  5.  Apply the CHG Soap to your body ONLY FROM THE NECK DOWN.   Do   not use on face/ open                           Wound or open sores. Avoid contact with eyes, ears mouth and   genitals (private parts).                       Wash face,  Genitals (private parts) with your normal soap.             6.  Wash thoroughly, paying special attention to the area where your    surgery  will be performed.  7.  Thoroughly rinse your body with warm water from the neck down.  8.  DO NOT shower/wash with your normal soap after using and rinsing off the CHG Soap.                9.  Pat yourself dry with a clean towel.            10.  Wear clean pajamas.            11.  Place clean sheets on your bed the night of your first shower and do not  sleep with pets. Day of Surgery : Do not apply any lotions/deodorants the morning of surgery.  Please wear  clean clothes to the hospital/surgery center.  FAILURE TO FOLLOW THESE INSTRUCTIONS MAY RESULT IN THE CANCELLATION OF YOUR SURGERY  PATIENT SIGNATURE_________________________________  NURSE SIGNATURE__________________________________  ________________________________________________________________________

## 2023-04-25 ENCOUNTER — Encounter (HOSPITAL_COMMUNITY): Payer: Self-pay

## 2023-04-25 ENCOUNTER — Other Ambulatory Visit: Payer: Self-pay

## 2023-04-25 ENCOUNTER — Ambulatory Visit (HOSPITAL_COMMUNITY)
Admission: RE | Admit: 2023-04-25 | Discharge: 2023-04-25 | Disposition: A | Discharge status: Home / Self Care | Payer: PPO | Source: Ambulatory Visit | Attending: Radiology

## 2023-04-25 ENCOUNTER — Encounter (HOSPITAL_COMMUNITY)
Admission: RE | Admit: 2023-04-25 | Discharge: 2023-04-25 | Disposition: A | Discharge status: Home / Self Care | Payer: PPO | Source: Ambulatory Visit | Attending: Interventional Radiology | Admitting: Interventional Radiology

## 2023-04-25 VITALS — BP 125/75 | HR 69 | Temp 98.3°F | Resp 18 | Ht 72.0 in | Wt 233.0 lb

## 2023-04-25 DIAGNOSIS — Z794 Long term (current) use of insulin: Secondary | ICD-10-CM | POA: Insufficient documentation

## 2023-04-25 DIAGNOSIS — D49511 Neoplasm of unspecified behavior of right kidney: Secondary | ICD-10-CM | POA: Diagnosis not present

## 2023-04-25 DIAGNOSIS — Z01818 Encounter for other preprocedural examination: Secondary | ICD-10-CM | POA: Diagnosis not present

## 2023-04-25 DIAGNOSIS — Z01811 Encounter for preprocedural respiratory examination: Secondary | ICD-10-CM | POA: Diagnosis present

## 2023-04-25 DIAGNOSIS — E119 Type 2 diabetes mellitus without complications: Secondary | ICD-10-CM | POA: Insufficient documentation

## 2023-04-25 DIAGNOSIS — Z01812 Encounter for preprocedural laboratory examination: Secondary | ICD-10-CM | POA: Diagnosis present

## 2023-04-25 DIAGNOSIS — Z01810 Encounter for preprocedural cardiovascular examination: Secondary | ICD-10-CM | POA: Diagnosis present

## 2023-04-25 HISTORY — DX: Malignant neoplasm of unspecified kidney, except renal pelvis: C64.9

## 2023-04-25 HISTORY — DX: Unspecified cirrhosis of liver: K74.60

## 2023-04-25 LAB — BASIC METABOLIC PANEL
Anion gap: 8 (ref 5–15)
BUN: 19 mg/dL (ref 8–23)
CO2: 26 mmol/L (ref 22–32)
Calcium: 8.7 mg/dL — ABNORMAL LOW (ref 8.9–10.3)
Chloride: 102 mmol/L (ref 98–111)
Creatinine, Ser: 0.76 mg/dL (ref 0.61–1.24)
GFR, Estimated: >60 mL/min (ref >60)
Glucose, Bld: 112 mg/dL — ABNORMAL HIGH (ref 70–99)
Potassium: 3.7 mmol/L (ref 3.5–5.1)
Sodium: 136 mmol/L (ref 135–145)

## 2023-04-25 LAB — CBC WITH DIFFERENTIAL/PLATELET
Abs Immature Granulocytes: 0.02 10*3/uL (ref 0.00–0.07)
Basophils Absolute: 0 10*3/uL (ref 0.0–0.1)
Basophils Relative: 1 %
Eosinophils Absolute: 0.1 10*3/uL (ref 0.0–0.5)
Eosinophils Relative: 2 %
HCT: 43.5 % (ref 39.0–52.0)
Hemoglobin: 14.2 g/dL (ref 13.0–17.0)
Immature Granulocytes: 0 %
Lymphocytes Relative: 27 %
Lymphs Abs: 1.4 10*3/uL (ref 0.7–4.0)
MCH: 30.1 pg (ref 26.0–34.0)
MCHC: 32.6 g/dL (ref 30.0–36.0)
MCV: 92.4 fL (ref 80.0–100.0)
Monocytes Absolute: 0.3 10*3/uL (ref 0.1–1.0)
Monocytes Relative: 6 %
Neutro Abs: 3.3 10*3/uL (ref 1.7–7.7)
Neutrophils Relative %: 64 %
Platelets: 74 10*3/uL — ABNORMAL LOW (ref 150–400)
RBC: 4.71 MIL/uL (ref 4.22–5.81)
RDW: 14 % (ref 11.5–15.5)
WBC: 5.1 10*3/uL (ref 4.0–10.5)
nRBC: 0 % (ref 0.0–0.2)

## 2023-04-25 LAB — PROTIME-INR
INR: 1.3 — ABNORMAL HIGH (ref 0.8–1.2)
Prothrombin Time: 16 s — ABNORMAL HIGH (ref 11.4–15.2)

## 2023-04-25 LAB — GLUCOSE, CAPILLARY: Glucose-Capillary: 110 mg/dL — ABNORMAL HIGH (ref 70–99)

## 2023-04-25 LAB — HEMOGLOBIN A1C
Hgb A1c MFr Bld: 7.3 % — ABNORMAL HIGH (ref 4.8–5.6)
Mean Plasma Glucose: 162.81 mg/dL

## 2023-04-25 NOTE — Progress Notes (Signed)
Platelets 74, results routed to Dr. Deanne Coffer

## 2023-04-28 ENCOUNTER — Encounter (HOSPITAL_COMMUNITY): Payer: Self-pay | Admitting: Physician Assistant

## 2023-04-29 ENCOUNTER — Other Ambulatory Visit: Payer: Self-pay | Admitting: Urology

## 2023-04-29 ENCOUNTER — Other Ambulatory Visit: Payer: Self-pay | Admitting: Student

## 2023-04-29 DIAGNOSIS — Z01818 Encounter for other preprocedural examination: Secondary | ICD-10-CM

## 2023-04-29 NOTE — Anesthesia Preprocedure Evaluation (Signed)
Anesthesia Evaluation  Patient identified by MRN, date of birth, ID band Patient awake    Reviewed: Allergy & Precautions, H&P , NPO status , Patient's Chart, lab work & pertinent test results  Airway Mallampati: II  TM Distance: >3 FB Neck ROM: Full    Dental no notable dental hx. (+) Teeth Intact, Dental Advisory Given   Pulmonary neg pulmonary ROS   Pulmonary exam normal breath sounds clear to auscultation       Cardiovascular Exercise Tolerance: Good hypertension, Pt. on medications  Rhythm:Regular Rate:Normal     Neuro/Psych  Neuromuscular disease  negative psych ROS   GI/Hepatic Neg liver ROS,GERD  Medicated,,  Endo/Other  diabetes, Insulin Dependent    Renal/GU Renal disease  negative genitourinary   Musculoskeletal   Abdominal   Peds  Hematology negative hematology ROS (+)   Anesthesia Other Findings   Reproductive/Obstetrics negative OB ROS                             Anesthesia Physical Anesthesia Plan  ASA: 3  Anesthesia Plan: General   Post-op Pain Management: Tylenol PO (pre-op)*   Induction: Intravenous  PONV Risk Score and Plan: 3 and Dexamethasone and Treatment may vary due to age or medical condition  Airway Management Planned: Oral ETT  Additional Equipment:   Intra-op Plan:   Post-operative Plan: Extubation in OR  Informed Consent: I have reviewed the patients History and Physical, chart, labs and discussed the procedure including the risks, benefits and alternatives for the proposed anesthesia with the patient or authorized representative who has indicated his/her understanding and acceptance.     Dental advisory given  Plan Discussed with: CRNA  Anesthesia Plan Comments:        Anesthesia Quick Evaluation

## 2023-04-30 ENCOUNTER — Ambulatory Visit (HOSPITAL_COMMUNITY)
Admission: RE | Admit: 2023-04-30 | Discharge: 2023-04-30 | Disposition: A | Discharge status: Home / Self Care | Payer: PPO | Source: Ambulatory Visit | Attending: Interventional Radiology | Admitting: Interventional Radiology

## 2023-04-30 ENCOUNTER — Encounter (HOSPITAL_COMMUNITY): Payer: Self-pay | Admitting: Interventional Radiology

## 2023-04-30 ENCOUNTER — Ambulatory Visit (HOSPITAL_COMMUNITY): Payer: Self-pay | Admitting: Anesthesiology

## 2023-04-30 ENCOUNTER — Encounter (HOSPITAL_COMMUNITY)
Admission: RE | Disposition: A | Discharge status: Home / Self Care | Payer: Self-pay | Source: Ambulatory Visit | Attending: Interventional Radiology

## 2023-04-30 ENCOUNTER — Other Ambulatory Visit: Payer: Self-pay

## 2023-04-30 ENCOUNTER — Ambulatory Visit (HOSPITAL_BASED_OUTPATIENT_CLINIC_OR_DEPARTMENT_OTHER): Payer: PPO | Admitting: Anesthesiology

## 2023-04-30 DIAGNOSIS — I10 Essential (primary) hypertension: Secondary | ICD-10-CM | POA: Insufficient documentation

## 2023-04-30 DIAGNOSIS — Z7985 Long-term (current) use of injectable non-insulin antidiabetic drugs: Secondary | ICD-10-CM | POA: Insufficient documentation

## 2023-04-30 DIAGNOSIS — Z79899 Other long term (current) drug therapy: Secondary | ICD-10-CM | POA: Diagnosis not present

## 2023-04-30 DIAGNOSIS — Z794 Long term (current) use of insulin: Secondary | ICD-10-CM | POA: Insufficient documentation

## 2023-04-30 DIAGNOSIS — D49511 Neoplasm of unspecified behavior of right kidney: Secondary | ICD-10-CM

## 2023-04-30 DIAGNOSIS — K219 Gastro-esophageal reflux disease without esophagitis: Secondary | ICD-10-CM | POA: Insufficient documentation

## 2023-04-30 DIAGNOSIS — E119 Type 2 diabetes mellitus without complications: Secondary | ICD-10-CM | POA: Diagnosis not present

## 2023-04-30 DIAGNOSIS — C641 Malignant neoplasm of right kidney, except renal pelvis: Secondary | ICD-10-CM

## 2023-04-30 DIAGNOSIS — E785 Hyperlipidemia, unspecified: Secondary | ICD-10-CM | POA: Diagnosis not present

## 2023-04-30 DIAGNOSIS — N281 Cyst of kidney, acquired: Secondary | ICD-10-CM | POA: Insufficient documentation

## 2023-04-30 DIAGNOSIS — N2889 Other specified disorders of kidney and ureter: Secondary | ICD-10-CM | POA: Insufficient documentation

## 2023-04-30 HISTORY — PX: RADIOLOGY WITH ANESTHESIA: SHX6223

## 2023-04-30 LAB — GLUCOSE, CAPILLARY: Glucose-Capillary: 163 mg/dL — ABNORMAL HIGH (ref 70–99)

## 2023-04-30 SURGERY — CT WITH ANESTHESIA
Anesthesia: General

## 2023-04-30 MED ORDER — FENTANYL CITRATE (PF) 100 MCG/2ML IJ SOLN
INTRAMUSCULAR | Status: DC | PRN
  Administered 2023-04-30: 100 ug via INTRAVENOUS

## 2023-04-30 MED ORDER — CHLORHEXIDINE GLUCONATE 0.12 % MT SOLN
15.0000 mL | Freq: Once | OROMUCOSAL | Status: AC
  Administered 2023-04-30: 15 mL via OROMUCOSAL

## 2023-04-30 MED ORDER — FENTANYL CITRATE (PF) 100 MCG/2ML IJ SOLN
INTRAMUSCULAR | Status: AC
  Filled 2023-04-30: qty 2

## 2023-04-30 MED ORDER — MIDAZOLAM HCL 5 MG/5ML IJ SOLN
INTRAMUSCULAR | Status: DC | PRN
  Administered 2023-04-30: 2 mg via INTRAVENOUS

## 2023-04-30 MED ORDER — EPHEDRINE SULFATE-NACL 50-0.9 MG/10ML-% IV SOSY
PREFILLED_SYRINGE | INTRAVENOUS | Status: DC | PRN
  Administered 2023-04-30 (×2): 5 mg via INTRAVENOUS

## 2023-04-30 MED ORDER — PROPOFOL 10 MG/ML IV BOLUS
INTRAVENOUS | Status: DC | PRN
  Administered 2023-04-30: 110 mg via INTRAVENOUS

## 2023-04-30 MED ORDER — MIDAZOLAM HCL 2 MG/2ML IJ SOLN
INTRAMUSCULAR | Status: AC
  Filled 2023-04-30: qty 2

## 2023-04-30 MED ORDER — INSULIN ASPART 100 UNIT/ML IJ SOLN: 0.0000 [IU] | INTRAMUSCULAR | Status: DC | PRN

## 2023-04-30 MED ORDER — ROCURONIUM BROMIDE 100 MG/10ML IV SOLN
INTRAVENOUS | Status: DC | PRN
  Administered 2023-04-30: 20 mg via INTRAVENOUS
  Administered 2023-04-30: 80 mg via INTRAVENOUS

## 2023-04-30 MED ORDER — PHENYLEPHRINE 80 MCG/ML (10ML) SYRINGE FOR IV PUSH (FOR BLOOD PRESSURE SUPPORT)
PREFILLED_SYRINGE | INTRAVENOUS | Status: DC | PRN
  Administered 2023-04-30 (×3): 160 ug via INTRAVENOUS
  Administered 2023-04-30: 80 ug via INTRAVENOUS

## 2023-04-30 MED ORDER — DEXAMETHASONE SODIUM PHOSPHATE 10 MG/ML IJ SOLN
INTRAMUSCULAR | Status: DC | PRN
  Administered 2023-04-30: 5 mg via INTRAVENOUS

## 2023-04-30 MED ORDER — ACETAMINOPHEN 500 MG PO TABS
1000.0000 mg | ORAL_TABLET | Freq: Once | ORAL | Status: AC
  Administered 2023-04-30: 1000 mg via ORAL
  Filled 2023-04-30: qty 2

## 2023-04-30 MED ORDER — SUGAMMADEX SODIUM 200 MG/2ML IV SOLN
INTRAVENOUS | Status: DC | PRN
  Administered 2023-04-30: 400 mg via INTRAVENOUS

## 2023-04-30 MED ORDER — ORAL CARE MOUTH RINSE: 15.0000 mL | Freq: Once | OROMUCOSAL | Status: AC

## 2023-04-30 MED ORDER — LIDOCAINE HCL (CARDIAC) PF 100 MG/5ML IV SOSY
PREFILLED_SYRINGE | INTRAVENOUS | Status: DC | PRN
  Administered 2023-04-30: 60 mg via INTRAVENOUS

## 2023-04-30 MED ORDER — LACTATED RINGERS IV SOLN: INTRAVENOUS | Status: DC

## 2023-04-30 MED ORDER — FENTANYL CITRATE PF 50 MCG/ML IJ SOSY
25.0000 ug | PREFILLED_SYRINGE | INTRAMUSCULAR | Status: DC | PRN
Start: 2023-04-30 — End: 2023-04-30

## 2023-04-30 MED ORDER — PHENYLEPHRINE HCL-NACL 20-0.9 MG/250ML-% IV SOLN
INTRAVENOUS | Status: DC | PRN
  Administered 2023-04-30: 30 ug/min via INTRAVENOUS

## 2023-04-30 MED ORDER — ONDANSETRON HCL 4 MG/2ML IJ SOLN
INTRAMUSCULAR | Status: DC | PRN
  Administered 2023-04-30: 4 mg via INTRAVENOUS

## 2023-04-30 NOTE — Sedation Documentation (Signed)
Anesthesia at bedside to sedate and monitor. 

## 2023-04-30 NOTE — H&P (Deleted)
The note originally documented on this encounter has been moved the the encounter in which it belongs.

## 2023-04-30 NOTE — Procedures (Signed)
  Procedure:  CT R renal lesion core biopsy and cryoablation   Preprocedure diagnosis: The encounter diagnosis was Neoplasm of right kidney. Postprocedure diagnosis: same EBL:    minimal Complications:   none immediate  See full dictation in [employer].  Thora Lance MD Main # [phone] Pager  [phone] Mobile [phone]

## 2023-04-30 NOTE — Anesthesia Procedure Notes (Signed)
Procedure Name: Intubation Date/Time: 04/30/2023 8:55 AM  Performed by: Sampson Goon, CRNAPre-anesthesia Checklist: Patient identified, Emergency Drugs available, Suction available and Patient being monitored Patient Re-evaluated:Patient Re-evaluated prior to induction Oxygen Delivery Method: Circle System Utilized Preoxygenation: Pre-oxygenation with 100% oxygen Induction Type: IV induction Ventilation: Mask ventilation without difficulty and Oral airway inserted - appropriate to patient size Laryngoscope Size: Mac and 4 Grade View: Grade II Tube type: Oral Tube size: 7.5 mm Number of attempts: 1 Airway Equipment and Method: Stylet and Oral airway Placement Confirmation: ETT inserted through vocal cords under direct vision, positive ETCO2 and breath sounds checked- equal and bilateral Secured at: 24 cm Tube secured with: Tape Dental Injury: Teeth and Oropharynx as per pre-operative assessment

## 2023-04-30 NOTE — H&P (Signed)
Chief Complaint: Patient was seen in consultation today for right renal mass   Referring Physician(s): Hassell,Daniel  Supervising Physician: Oley Balm  Patient Status: [facility] - Out-pt  History of Present Illness: Albert Andrews is a 73 y.o. male being worked up for right renal mass. After consultation with Dr. Deanne Coffer, see full consult note from 1/8. He is now scheduled for image guided cryoablation of right renal with biopsy.  PMHx, meds, labs, imaging, allergies reviewed. Feels well, no recent fevers, chills, illness. Has been NPO today as directed. Family at bedside.   Past Medical History:  Diagnosis Date   ALS (amyotrophic lateral sclerosis) (HCC)    Cancer of kidney (HCC)    Cirrhosis of liver (HCC)    DM type 2 (diabetes mellitus, type 2) (HCC)    GERD (gastroesophageal reflux disease)    Hyperlipidemia    Hypertension    Melanoma (HCC) 08/02/2020   removed from both legs areas healed    Prostate cancer (HCC)    Urinary frequency    Weakness of both lower extremities    due to dm uses power wc    Past Surgical History:  Procedure Laterality Date   ADENOIDECTOMY, TONSILLECTOMY AND MYRINGOTOMY WITH TUBE PLACEMENT  as child   BIOPSY  01/07/2020   Procedure: BIOPSY;  Surgeon: Jeani Hawking, MD;  Location: [facility];  Service: Endoscopy;;   COLONOSCOPY WITH PROPOFOL N/A 01/26/2021   Procedure: COLONOSCOPY WITH PROPOFOL;  Surgeon: Jeani Hawking, MD;  Location: [facility];  Service: Endoscopy;  Laterality: N/A;   ESOPHAGOGASTRODUODENOSCOPY (EGD) WITH PROPOFOL N/A 01/07/2020   Procedure: ESOPHAGOGASTRODUODENOSCOPY (EGD) WITH PROPOFOL;  Surgeon: Jeani Hawking, MD;  Location: [facility];  Service: Endoscopy;  Laterality: N/A;   POLYPECTOMY  01/26/2021   Procedure: POLYPECTOMY;  Surgeon: Jeani Hawking, MD;  Location: [facility];  Service: Endoscopy;;   PROSTATE BIOPSY     RADIOACTIVE SEED IMPLANT N/A 09/01/2020   Procedure: RADIOACTIVE SEED  IMPLANT/BRACHYTHERAPY IMPLANT;  Surgeon: Rene Paci, MD;  Location: [facility];  Service: Urology;  Laterality: N/A;   ROTATOR CUFF REPAIR Left 1994   SPACE OAR INSTILLATION N/A 09/01/2020   Procedure: SPACE OAR INSTILLATION;  Surgeon: Rene Paci, MD;  Location: [facility];  Service: Urology;  Laterality: N/A;    Allergies: Dulaglutide, Jardiance [empagliflozin], and Metformin and related  Medications: Prior to Admission medications   Medication Sig Start Date End Date Taking? Authorizing Provider  Blood Glucose Monitoring Suppl (FREESTYLE FREEDOM LITE) w/Device KIT USE TO CHECK GLUCOSE ONCE DAILY 05/02/18   [provider]  fexofenadine (ALLEGRA) 180 MG tablet Take 180 mg by mouth at bedtime.    [provider]  Insulin Degludec (TRESIBA) 100 UNIT/ML SOLN Inject 50 Units into the skin 2 (two) times daily.    [provider]  losartan-hydrochlorothiazide (HYZAAR) 100-12.5 MG tablet Take 1 tablet by mouth daily.    [provider]  meclizine (ANTIVERT) 12.5 MG tablet Take 12.5 mg by mouth at bedtime.    [provider]  NOVOLOG FLEXPEN 100 UNIT/ML FlexPen Inject 5-10 Units into the skin 3 (three) times daily before meals. 03/12/20   [provider]  OVER THE COUNTER MEDICATION Take 1 tablet by mouth at bedtime. Mirica    [provider]  OZEMPIC, 1 MG/DOSE, 4 MG/3ML SOPN Inject 1 mg into the skin every Saturday. 04/06/23   [provider]  pantoprazole (PROTONIX) 20 MG tablet Take 20 mg by mouth daily.  [provider]  pregabalin (LYRICA) 100 MG capsule Take 100 mg by mouth 2 (two) times daily. 01/21/20 04/22/23  [provider]  simvastatin (ZOCOR) 10 MG tablet Take 10 mg by mouth every evening.     [provider]  tamsulosin (FLOMAX) 0.4 MG CAPS capsule Take 0.4 mg by mouth 2 (two) times daily.    [provider]      Family History  Problem Relation Age of Onset   Cancer Brother        unknown   Prostate cancer Neg Hx     Social History   Socioeconomic History   Marital status: Married    Spouse name: Not on file   Number of children: Not on file   Years of education: Not on file   Highest education level: Not on file  Occupational History    Comment: retired  Tobacco Use   Smoking status: Never   Smokeless tobacco: Never  Vaping Use   Vaping status: Never Used  Substance and Sexual Activity   Alcohol use: Not Currently   Drug use: No   Sexual activity: Not Currently  Other Topics Concern   Not on file  Social History Narrative   Not on file   Social Drivers of Health   Financial Resource Strain: Not on file  Food Insecurity: No Food Insecurity (03/28/2020)   Hunger Vital Sign    Worried About Running Out of Food in the Last Year: Never true    Ran Out of Food in the Last Year: Never true  Transportation Needs: Not on file  Physical Activity: Not on file  Stress: Not on file  Social Connections: Not on file    Review of Systems: A 12 point ROS discussed and pertinent positives are indicated in the HPI above.  All other systems are negative.  Review of Systems  Vital Signs: T: 98.4 HR: 73 BP: 141/74 RR: 14   Physical Exam Constitutional:      Appearance: He is not ill-appearing.  HENT:     Mouth/Throat:     Mouth: Mucous membranes are moist.     Pharynx: Oropharynx is clear.  Cardiovascular:     Rate and Rhythm: Normal rate and regular rhythm.     Heart sounds: Normal heart sounds.  Pulmonary:     Effort: Pulmonary effort is normal. No respiratory distress.     Breath sounds: Normal breath sounds.  Abdominal:     General: There is no distension.     Palpations: Abdomen is soft. There is no mass.     Tenderness: There is no abdominal tenderness.  Skin:    General: Skin is warm and dry.  Neurological:     General: No focal deficit present.     Mental  Status: He is alert and oriented to person, place, and time.  Psychiatric:        Mood and Affect: Mood normal.        Thought Content: Thought content normal.     Imaging: DG Chest 1 View Result Date: 04/25/2023 CLINICAL DATA:  Preprocedural exam. Neoplasm of right kidney. Upcoming kidney cryoablation. History of ALS. EXAM: CHEST  1 VIEW COMPARISON:  09/13/2020 FINDINGS: Stable heart size and mediastinal contours. Mild chronic elevation of right hemidiaphragm. Stable blunting of left costophrenic angle likely mild scarring. No confluent airspace disease. No pleural fluid or pneumothorax. Right shoulder arthropathy. IMPRESSION: No active disease. Electronically Signed   By: Narda Rutherford M.D.   On:  04/25/2023 16:16    Labs:  CBC: Recent Labs    04/25/23 1322  WBC 5.1  HGB 14.2  HCT 43.5  PLT 74*    COAGS: Recent Labs    04/25/23 1322  INR 1.3*    BMP: Recent Labs    04/25/23 1322  NA 136  K 3.7  CL 102  CO2 26  GLUCOSE 112*  BUN 19  CALCIUM 8.7*  CREATININE 0.76  GFRNONAA >60    LIVER FUNCTION TESTS: No results for input(s): "BILITOT", "AST", "ALT", "ALKPHOS", "PROT", "ALBUMIN" in the last 8760 hours.   Assessment and Plan: Right renal mass For image guided cryoablation and biopsy. Labs reviewed. Risks and benefits of image guided renal cryoablation was discussed with the patient including, but not limited to, failure to treat entire lesion, bleeding, infection, damage to adjacent structures, hematuria, urine leak, decrease in renal function or post procedural neuropathy.  All of the patient's questions were answered and the patient is agreeable to proceed. Consent signed and in chart.    Electronically Signed: Brayton El, PA-C 04/30/2023, 8:12 AM   I spent a total of 20 minutes in face to face in clinical consultation, greater than 50% of which was counseling/coordinating care for renal cryoablation

## 2023-04-30 NOTE — Anesthesia Postprocedure Evaluation (Signed)
Anesthesia Post Note  Patient: Albert Andrews  Procedure(s) Performed: CT CYRO ABLATION     Patient location during evaluation: PACU Anesthesia Type: General Level of consciousness: awake and alert Pain management: pain level controlled Vital Signs Assessment: post-procedure vital signs reviewed and stable Respiratory status: spontaneous breathing, nonlabored ventilation and respiratory function stable Cardiovascular status: blood pressure returned to baseline and stable Postop Assessment: no apparent nausea or vomiting Anesthetic complications: no  No notable events documented.  Last Vitals:  Vitals:   04/30/23 1215 04/30/23 1220  BP: (!) 152/82 (!) 156/78  Pulse: 69 70  Resp: 13 12  Temp:  (!) 36.4 C  SpO2: 93% 92%    Last Pain:  Vitals:   04/30/23 1220  TempSrc:   PainSc: 0-No pain                 Elie Gragert,W. EDMOND

## 2023-04-30 NOTE — Transfer of Care (Signed)
Immediate Anesthesia Transfer of Care Note  Patient: Albert Andrews  Procedure(s) Performed: CT CYRO ABLATION  Patient Location: PACU  Anesthesia Type:General  Level of Consciousness: awake  Airway & Oxygen Therapy: Patient Spontanous Breathing and Patient connected to face mask oxygen  Post-op Assessment: Report given to RN and Post -op Vital signs reviewed and stable  Post vital signs: Reviewed and stable  Last Vitals:  Vitals Value Taken Time  BP 137/71 04/30/23 1115  Temp    Pulse 71 04/30/23 1118  Resp 19 04/30/23 1118  SpO2 96 % 04/30/23 1118  Vitals shown include unfiled device data.  Last Pain:  Vitals:   04/30/23 0643  TempSrc: Oral  PainSc: 0-No pain         Complications: No notable events documented.

## 2023-05-01 ENCOUNTER — Encounter (HOSPITAL_COMMUNITY): Payer: Self-pay | Admitting: Interventional Radiology

## 2023-05-01 LAB — SURGICAL PATHOLOGY

## 2023-05-07 ENCOUNTER — Other Ambulatory Visit: Payer: Self-pay | Admitting: Radiology

## 2023-05-14 ENCOUNTER — Other Ambulatory Visit: Payer: Self-pay | Admitting: Urology

## 2023-05-14 DIAGNOSIS — D49511 Neoplasm of unspecified behavior of right kidney: Secondary | ICD-10-CM

## 2023-05-21 ENCOUNTER — Ambulatory Visit
Admission: RE | Admit: 2023-05-21 | Discharge: 2023-05-21 | Disposition: A | Discharge status: Home / Self Care | Payer: PPO | Source: Ambulatory Visit | Attending: Urology | Admitting: Urology

## 2023-05-21 DIAGNOSIS — D49511 Neoplasm of unspecified behavior of right kidney: Secondary | ICD-10-CM

## 2023-05-21 NOTE — Progress Notes (Signed)
 Patient ID: Albert Andrews, male   DOB: 05/03/1950, 73 y.o.   MRN: [id number]       Chief Complaint: Patient was consulted remotely today (TeleHealth) for follow up renal cryoablation at the request of Winter,Christopher Clifton Custard.    Referring Physician(s): Winter,Christopher Clifton Custard  History of Present Illness: Albert Andrews is a 73 y.o. male history of enlarging right  cystic renal neoplasm, post cryoablation and biopsy 04/30/23. He has done well post procedure. Minimal site pain. Minimal hematuria has resolved. No new symptoms. Biopsy considered nondiagnostic as it showed only cyst components.  Past Medical History:  Diagnosis Date   ALS (amyotrophic lateral sclerosis) (HCC)    Cancer of kidney (HCC)    Cirrhosis of liver (HCC)    DM type 2 (diabetes mellitus, type 2) (HCC)    GERD (gastroesophageal reflux disease)    Hyperlipidemia    Hypertension    Melanoma (HCC) 08/02/2020   removed from both legs areas healed    Prostate cancer (HCC)    Urinary frequency    Weakness of both lower extremities    due to dm uses power wc    Past Surgical History:  Procedure Laterality Date   ADENOIDECTOMY, TONSILLECTOMY AND MYRINGOTOMY WITH TUBE PLACEMENT  as child   BIOPSY  01/07/2020   Procedure: BIOPSY;  Surgeon: Jeani Hawking, MD;  Location: [facility];  Service: Endoscopy;;   COLONOSCOPY WITH PROPOFOL N/A 01/26/2021   Procedure: COLONOSCOPY WITH PROPOFOL;  Surgeon: Jeani Hawking, MD;  Location: [facility];  Service: Endoscopy;  Laterality: N/A;   ESOPHAGOGASTRODUODENOSCOPY (EGD) WITH PROPOFOL N/A 01/07/2020   Procedure: ESOPHAGOGASTRODUODENOSCOPY (EGD) WITH PROPOFOL;  Surgeon: Jeani Hawking, MD;  Location: [facility];  Service: Endoscopy;  Laterality: N/A;   POLYPECTOMY  01/26/2021   Procedure: POLYPECTOMY;  Surgeon: Jeani Hawking, MD;  Location: [facility];  Service: Endoscopy;;   PROSTATE BIOPSY     RADIOACTIVE SEED IMPLANT N/A 09/01/2020   Procedure: RADIOACTIVE SEED  IMPLANT/BRACHYTHERAPY IMPLANT;  Surgeon: Rene Paci, MD;  Location: [facility];  Service: Urology;  Laterality: N/A;   RADIOLOGY WITH ANESTHESIA N/A 04/30/2023   Procedure: CT CYRO ABLATION;  Surgeon: Oley Balm, MD;  Location: [facility];  Service: Radiology;  Laterality: N/A;   ROTATOR CUFF REPAIR Left 1994   SPACE OAR INSTILLATION N/A 09/01/2020   Procedure: SPACE OAR INSTILLATION;  Surgeon: Rene Paci, MD;  Location: [facility];  Service: Urology;  Laterality: N/A;    Allergies: Dulaglutide, Jardiance [empagliflozin], and Metformin and related  Medications: Prior to Admission medications   Medication Sig Start Date End Date Taking? Authorizing Provider  Blood Glucose Monitoring Suppl (FREESTYLE FREEDOM LITE) w/Device KIT USE TO CHECK GLUCOSE ONCE DAILY 05/02/18   [provider]  fexofenadine (ALLEGRA) 180 MG tablet Take 180 mg by mouth at bedtime.    [provider]  Insulin Degludec (TRESIBA) 100 UNIT/ML SOLN Inject 50 Units into the skin 2 (two) times daily.    [provider]  losartan-hydrochlorothiazide (HYZAAR) 100-12.5 MG tablet Take 1 tablet by mouth daily.    [provider]  meclizine (ANTIVERT) 12.5 MG tablet Take 12.5 mg by mouth at bedtime.    [provider]  NOVOLOG FLEXPEN 100 UNIT/ML FlexPen Inject 5-10 Units into the skin 3 (three) times daily before meals. 03/12/20   [provider]  OVER THE COUNTER MEDICATION Take 1 tablet by mouth at bedtime. Mirica    [provider]  OZEMPIC, 1 MG/DOSE, 4 MG/3ML  SOPN Inject 1 mg into the skin every Saturday. 04/06/23   [provider]  pantoprazole (PROTONIX) 20 MG tablet Take 20 mg by mouth daily.    [provider]  pregabalin (LYRICA) 100 MG capsule Take 100 mg by mouth 2 (two) times daily. 01/21/20 04/22/23  [provider]  simvastatin (ZOCOR) 10 MG tablet Take 10 mg by mouth  every evening.     [provider]  tamsulosin (FLOMAX) 0.4 MG CAPS capsule Take 0.4 mg by mouth 2 (two) times daily.    [provider]     Family History  Problem Relation Age of Onset   Cancer Brother        unknown   Prostate cancer Neg Hx     Social History   Socioeconomic History   Marital status: Married    Spouse name: Not on file   Number of children: Not on file   Years of education: Not on file   Highest education level: Not on file  Occupational History    Comment: retired  Tobacco Use   Smoking status: Never   Smokeless tobacco: Never  Vaping Use   Vaping status: Never Used  Substance and Sexual Activity   Alcohol use: Not Currently   Drug use: No   Sexual activity: Not Currently  Other Topics Concern   Not on file  Social History Narrative   Not on file   Social Drivers of Health   Financial Resource Strain: Not on file  Food Insecurity: No Food Insecurity (03/28/2020)   Hunger Vital Sign    Worried About Running Out of Food in the Last Year: Never true    Ran Out of Food in the Last Year: Never true  Transportation Needs: Not on file  Physical Activity: Not on file  Stress: Not on file  Social Connections: Not on file    ECOG Status: 0 - Asymptomatic  Review of Systems  Review of Systems: A 12 point ROS discussed and pertinent positives are indicated in the HPI above.  All other systems are negative.  Physical Exam No direct physical exam was performed (except for noted visual exam findings with Video Visits).    Vital Signs: There were no vitals taken for this visit.  Imaging: CT GUIDE TISSUE ABLATION Result Date: 04/30/2023 INDICATION: Enlarging enhancing cystic right lower pole renal neoplasm EXAM: CT-GUIDED CORE BIOPSY RIGHT RENAL MASS CT-GUIDED PERCUTANEOUS CRYOABLATION OF RIGHT RENAL MASS ANESTHESIA/SEDATION: General MEDICATIONS: None CONTRAST:  None PROCEDURE: The procedure, risks, benefits, and alternatives were  explained to the patient. Questions regarding the procedure were encouraged and answered. The patient understands and consents to the procedure. The patient was placed under general anesthesia. Initial unenhanced CT was performed in a prone position to localize the right lower pole complex cystic lesion. The patient was prepped with chlorhexidine in a sterile fashion, and a sterile drape was applied covering the operative field. A sterile gown and sterile gloves were used for the procedure. Under CT guidance, 2 parallel percutaneous cryoablation probes were sequentially advanced into the superior and inferior margins of the lesion. Probe positioning was confirmed by CT prior to cryoablation. A 17 gauge trocar needle was then directed towards the midportion of the lesion between the 2 cryo needles under CT fluoroscopic guidance. Coaxial 18 gauge core biopsy samples were obtained and submitted in formalin to surgical pathology. The guide needle was removed. Cryoablation was performed through the 2 cryoablation probes. Initial 10 minute cycle of cryoablation was performed.  CT at this point showed excellent ice ball morphology and distribution around the lesion. No body wall involvement. This was followed by a 8 minute thaw cycle. A second 10 minute cycle of cryoablation was then performed. After active thaw, the cryoablation probes were removed. Post-procedural CT was performed. COMPLICATIONS: None immediate. FINDINGS: Cystic posterior right renal lesion was localized. After placement of placement of cryoablation probes, core biopsy samples were obtained as above. Ice ball morphology and distribution around the lesion was excellent after the first cryoablation cycle. After the second cycle and needle removal, no evidence of ice ball fracture, hemorrhage, or other apparent complication. IMPRESSION: CT guided percutaneous core biopsy and cryoablation of right renal mass. Initial follow-up will be performed in  approximately 4 weeks. Electronically Signed   By: Corlis Leak M.D.   On: 04/30/2023 12:06   CT RENAL BIOPSY Result Date: 04/30/2023 INDICATION: Enlarging enhancing cystic right lower pole renal neoplasm EXAM: CT-GUIDED CORE BIOPSY RIGHT RENAL MASS CT-GUIDED PERCUTANEOUS CRYOABLATION OF RIGHT RENAL MASS ANESTHESIA/SEDATION: General MEDICATIONS: None CONTRAST:  None PROCEDURE: The procedure, risks, benefits, and alternatives were explained to the patient. Questions regarding the procedure were encouraged and answered. The patient understands and consents to the procedure. The patient was placed under general anesthesia. Initial unenhanced CT was performed in a prone position to localize the right lower pole complex cystic lesion. The patient was prepped with chlorhexidine in a sterile fashion, and a sterile drape was applied covering the operative field. A sterile gown and sterile gloves were used for the procedure. Under CT guidance, 2 parallel percutaneous cryoablation probes were sequentially advanced into the superior and inferior margins of the lesion. Probe positioning was confirmed by CT prior to cryoablation. A 17 gauge trocar needle was then directed towards the midportion of the lesion between the 2 cryo needles under CT fluoroscopic guidance. Coaxial 18 gauge core biopsy samples were obtained and submitted in formalin to surgical pathology. The guide needle was removed. Cryoablation was performed through the 2 cryoablation probes. Initial 10 minute cycle of cryoablation was performed. CT at this point showed excellent ice ball morphology and distribution around the lesion. No body wall involvement. This was followed by a 8 minute thaw cycle. A second 10 minute cycle of cryoablation was then performed. After active thaw, the cryoablation probes were removed. Post-procedural CT was performed. COMPLICATIONS: None immediate. FINDINGS: Cystic posterior right renal lesion was localized. After placement of  placement of cryoablation probes, core biopsy samples were obtained as above. Ice ball morphology and distribution around the lesion was excellent after the first cryoablation cycle. After the second cycle and needle removal, no evidence of ice ball fracture, hemorrhage, or other apparent complication. IMPRESSION: CT guided percutaneous core biopsy and cryoablation of right renal mass. Initial follow-up will be performed in approximately 4 weeks. Electronically Signed   By: Corlis Leak M.D.   On: 04/30/2023 12:06   DG Chest 1 View Result Date: 04/25/2023 CLINICAL DATA:  Preprocedural exam. Neoplasm of right kidney. Upcoming kidney cryoablation. History of ALS. EXAM: CHEST  1 VIEW COMPARISON:  09/13/2020 FINDINGS: Stable heart size and mediastinal contours. Mild chronic elevation of right hemidiaphragm. Stable blunting of left costophrenic angle likely mild scarring. No confluent airspace disease. No pleural fluid or pneumothorax. Right shoulder arthropathy. IMPRESSION: No active disease. Electronically Signed   By: Narda Rutherford M.D.   On: 04/25/2023 16:16    Labs:  CBC: Recent Labs    04/25/23 1322  WBC 5.1  HGB 14.2  HCT 43.5  PLT 74*    COAGS: Recent Labs    04/25/23 1322  INR 1.3*    BMP: Recent Labs    04/25/23 1322  NA 136  K 3.7  CL 102  CO2 26  GLUCOSE 112*  BUN 19  CALCIUM 8.7*  CREATININE 0.76  GFRNONAA >60    LIVER FUNCTION TESTS: No results for input(s): "BILITOT", "AST", "ALT", "ALKPHOS", "PROT", "ALBUMIN" in the last 8760 hours.  TUMOR MARKERS: No results for input(s): "AFPTM", "CEA", "CA199", "CHROMGRNA" in the last 8760 hours.  Assessment and Plan:  My impression is that this patient is doing well post renal cryoablation. No evident complication. No activity restrictions. We will get followup scan in about 3 months to establish baseline, then recurring surveillance scans over lengthening intervals until stability x5 years is documented. He seemed to  understand and had no questions at this time. Knows to call with questions or problems in the meantime.   Thank you for this interesting consult.  I greatly enjoyed meeting Albert Andrews and look forward to participating in their care.  A copy of this report was sent to the requesting provider on this date.  Electronically Signed: Durwin Glaze 05/21/2023, 4:06 PM   I spent a total of    15 Minutes in remote  clinical consultation, greater than 50% of which was counseling/coordinating care for right renal cystic neoplasm post cryoablation.    Visit type: Audio only (telephone). Audio (no video) only due to patient's lack of internet/smartphone capability. Alternative for in-person consultation at [facility], [address] [place], [place], [place].  his format is felt to be most appropriate for this patient at this time.  All issues noted in this document were discussed and addressed.

## 2023-06-09 DIAGNOSIS — R262 Difficulty in walking, not elsewhere classified: Secondary | ICD-10-CM | POA: Diagnosis not present

## 2023-06-12 DIAGNOSIS — E1165 Type 2 diabetes mellitus with hyperglycemia: Secondary | ICD-10-CM | POA: Diagnosis not present

## 2023-06-16 DIAGNOSIS — R262 Difficulty in walking, not elsewhere classified: Secondary | ICD-10-CM | POA: Diagnosis not present

## 2023-06-18 DIAGNOSIS — R262 Difficulty in walking, not elsewhere classified: Secondary | ICD-10-CM | POA: Diagnosis not present

## 2023-06-19 DIAGNOSIS — E781 Pure hyperglyceridemia: Secondary | ICD-10-CM | POA: Diagnosis not present

## 2023-06-19 DIAGNOSIS — K7460 Unspecified cirrhosis of liver: Secondary | ICD-10-CM | POA: Diagnosis not present

## 2023-06-19 DIAGNOSIS — E1165 Type 2 diabetes mellitus with hyperglycemia: Secondary | ICD-10-CM | POA: Diagnosis not present

## 2023-06-19 DIAGNOSIS — C61 Malignant neoplasm of prostate: Secondary | ICD-10-CM | POA: Diagnosis not present

## 2023-06-19 DIAGNOSIS — E162 Hypoglycemia, unspecified: Secondary | ICD-10-CM | POA: Diagnosis not present

## 2023-06-19 DIAGNOSIS — I10 Essential (primary) hypertension: Secondary | ICD-10-CM | POA: Diagnosis not present

## 2023-06-19 DIAGNOSIS — G629 Polyneuropathy, unspecified: Secondary | ICD-10-CM | POA: Diagnosis not present

## 2023-06-25 ENCOUNTER — Encounter (HOSPITAL_BASED_OUTPATIENT_CLINIC_OR_DEPARTMENT_OTHER): Payer: PPO | Attending: General Surgery | Admitting: General Surgery

## 2023-06-25 DIAGNOSIS — L97822 Non-pressure chronic ulcer of other part of left lower leg with fat layer exposed: Secondary | ICD-10-CM | POA: Diagnosis not present

## 2023-06-25 DIAGNOSIS — T8189XA Other complications of procedures, not elsewhere classified, initial encounter: Secondary | ICD-10-CM | POA: Diagnosis not present

## 2023-06-25 DIAGNOSIS — R262 Difficulty in walking, not elsewhere classified: Secondary | ICD-10-CM | POA: Diagnosis not present

## 2023-06-25 DIAGNOSIS — E11622 Type 2 diabetes mellitus with other skin ulcer: Secondary | ICD-10-CM | POA: Insufficient documentation

## 2023-06-26 DIAGNOSIS — L97822 Non-pressure chronic ulcer of other part of left lower leg with fat layer exposed: Secondary | ICD-10-CM | POA: Diagnosis not present

## 2023-06-30 DIAGNOSIS — C61 Malignant neoplasm of prostate: Secondary | ICD-10-CM | POA: Diagnosis not present

## 2023-06-30 DIAGNOSIS — D696 Thrombocytopenia, unspecified: Secondary | ICD-10-CM | POA: Diagnosis not present

## 2023-06-30 DIAGNOSIS — Z85528 Personal history of other malignant neoplasm of kidney: Secondary | ICD-10-CM | POA: Diagnosis not present

## 2023-06-30 DIAGNOSIS — I10 Essential (primary) hypertension: Secondary | ICD-10-CM | POA: Diagnosis not present

## 2023-06-30 DIAGNOSIS — I700 Atherosclerosis of aorta: Secondary | ICD-10-CM | POA: Diagnosis not present

## 2023-06-30 DIAGNOSIS — Z85828 Personal history of other malignant neoplasm of skin: Secondary | ICD-10-CM | POA: Diagnosis not present

## 2023-06-30 DIAGNOSIS — K7460 Unspecified cirrhosis of liver: Secondary | ICD-10-CM | POA: Diagnosis not present

## 2023-06-30 DIAGNOSIS — N1831 Chronic kidney disease, stage 3a: Secondary | ICD-10-CM | POA: Diagnosis not present

## 2023-06-30 DIAGNOSIS — E1142 Type 2 diabetes mellitus with diabetic polyneuropathy: Secondary | ICD-10-CM | POA: Diagnosis not present

## 2023-06-30 DIAGNOSIS — Z794 Long term (current) use of insulin: Secondary | ICD-10-CM | POA: Diagnosis not present

## 2023-06-30 DIAGNOSIS — C44212 Basal cell carcinoma of skin of right ear and external auricular canal: Secondary | ICD-10-CM | POA: Diagnosis not present

## 2023-06-30 DIAGNOSIS — Z85820 Personal history of malignant melanoma of skin: Secondary | ICD-10-CM | POA: Diagnosis not present

## 2023-07-03 ENCOUNTER — Encounter (HOSPITAL_BASED_OUTPATIENT_CLINIC_OR_DEPARTMENT_OTHER): Admitting: General Surgery

## 2023-07-03 DIAGNOSIS — T8189XA Other complications of procedures, not elsewhere classified, initial encounter: Secondary | ICD-10-CM | POA: Diagnosis not present

## 2023-07-03 DIAGNOSIS — E11622 Type 2 diabetes mellitus with other skin ulcer: Secondary | ICD-10-CM | POA: Diagnosis not present

## 2023-07-07 DIAGNOSIS — R262 Difficulty in walking, not elsewhere classified: Secondary | ICD-10-CM | POA: Diagnosis not present

## 2023-07-09 DIAGNOSIS — E119 Type 2 diabetes mellitus without complications: Secondary | ICD-10-CM | POA: Diagnosis not present

## 2023-07-09 DIAGNOSIS — H4911 Fourth [trochlear] nerve palsy, right eye: Secondary | ICD-10-CM | POA: Diagnosis not present

## 2023-07-09 DIAGNOSIS — H2513 Age-related nuclear cataract, bilateral: Secondary | ICD-10-CM | POA: Diagnosis not present

## 2023-07-09 DIAGNOSIS — H353131 Nonexudative age-related macular degeneration, bilateral, early dry stage: Secondary | ICD-10-CM | POA: Diagnosis not present

## 2023-07-11 ENCOUNTER — Encounter (HOSPITAL_BASED_OUTPATIENT_CLINIC_OR_DEPARTMENT_OTHER): Attending: General Surgery | Admitting: General Surgery

## 2023-07-11 DIAGNOSIS — T8189XA Other complications of procedures, not elsewhere classified, initial encounter: Secondary | ICD-10-CM | POA: Diagnosis not present

## 2023-07-11 DIAGNOSIS — L97822 Non-pressure chronic ulcer of other part of left lower leg with fat layer exposed: Secondary | ICD-10-CM | POA: Diagnosis not present

## 2023-07-11 DIAGNOSIS — E11622 Type 2 diabetes mellitus with other skin ulcer: Secondary | ICD-10-CM | POA: Diagnosis not present

## 2023-07-11 DIAGNOSIS — G1221 Amyotrophic lateral sclerosis: Secondary | ICD-10-CM | POA: Insufficient documentation

## 2023-07-11 DIAGNOSIS — Z8546 Personal history of malignant neoplasm of prostate: Secondary | ICD-10-CM | POA: Diagnosis not present

## 2023-07-14 DIAGNOSIS — R262 Difficulty in walking, not elsewhere classified: Secondary | ICD-10-CM | POA: Diagnosis not present

## 2023-07-17 ENCOUNTER — Encounter (HOSPITAL_BASED_OUTPATIENT_CLINIC_OR_DEPARTMENT_OTHER): Admitting: General Surgery

## 2023-07-17 DIAGNOSIS — T8189XA Other complications of procedures, not elsewhere classified, initial encounter: Secondary | ICD-10-CM | POA: Diagnosis not present

## 2023-07-17 DIAGNOSIS — E11622 Type 2 diabetes mellitus with other skin ulcer: Secondary | ICD-10-CM | POA: Diagnosis not present

## 2023-07-17 DIAGNOSIS — L97822 Non-pressure chronic ulcer of other part of left lower leg with fat layer exposed: Secondary | ICD-10-CM | POA: Diagnosis not present

## 2023-07-25 ENCOUNTER — Encounter (HOSPITAL_BASED_OUTPATIENT_CLINIC_OR_DEPARTMENT_OTHER): Admitting: General Surgery

## 2023-07-25 DIAGNOSIS — L97822 Non-pressure chronic ulcer of other part of left lower leg with fat layer exposed: Secondary | ICD-10-CM | POA: Diagnosis not present

## 2023-07-25 DIAGNOSIS — T8189XD Other complications of procedures, not elsewhere classified, subsequent encounter: Secondary | ICD-10-CM | POA: Diagnosis not present

## 2023-07-25 DIAGNOSIS — E11622 Type 2 diabetes mellitus with other skin ulcer: Secondary | ICD-10-CM | POA: Diagnosis not present

## 2023-07-28 DIAGNOSIS — R262 Difficulty in walking, not elsewhere classified: Secondary | ICD-10-CM | POA: Diagnosis not present

## 2023-07-30 DIAGNOSIS — R262 Difficulty in walking, not elsewhere classified: Secondary | ICD-10-CM | POA: Diagnosis not present

## 2023-08-05 DIAGNOSIS — R197 Diarrhea, unspecified: Secondary | ICD-10-CM | POA: Diagnosis not present

## 2023-08-06 DIAGNOSIS — R262 Difficulty in walking, not elsewhere classified: Secondary | ICD-10-CM | POA: Diagnosis not present

## 2023-08-11 ENCOUNTER — Other Ambulatory Visit: Payer: Self-pay | Admitting: Interventional Radiology

## 2023-08-11 DIAGNOSIS — N281 Cyst of kidney, acquired: Secondary | ICD-10-CM

## 2023-08-13 ENCOUNTER — Ambulatory Visit (HOSPITAL_COMMUNITY)
Admission: RE | Admit: 2023-08-13 | Discharge: 2023-08-13 | Disposition: A | Discharge status: Home / Self Care | Source: Ambulatory Visit | Attending: Interventional Radiology | Admitting: Interventional Radiology

## 2023-08-13 DIAGNOSIS — N281 Cyst of kidney, acquired: Secondary | ICD-10-CM | POA: Insufficient documentation

## 2023-08-13 DIAGNOSIS — R262 Difficulty in walking, not elsewhere classified: Secondary | ICD-10-CM | POA: Diagnosis not present

## 2023-08-13 DIAGNOSIS — R161 Splenomegaly, not elsewhere classified: Secondary | ICD-10-CM | POA: Diagnosis not present

## 2023-08-13 DIAGNOSIS — K8020 Calculus of gallbladder without cholecystitis without obstruction: Secondary | ICD-10-CM | POA: Diagnosis not present

## 2023-08-13 MED ORDER — IOHEXOL 300 MG/ML  SOLN
100.0000 mL | Freq: Once | INTRAMUSCULAR | Status: AC | PRN
  Administered 2023-08-13: 100 mL via INTRAVENOUS

## 2023-08-13 MED ORDER — SODIUM CHLORIDE (PF) 0.9 % IJ SOLN
INTRAMUSCULAR | Status: AC
  Filled 2023-08-13: qty 50

## 2023-08-18 ENCOUNTER — Ambulatory Visit
Admission: RE | Admit: 2023-08-18 | Discharge: 2023-08-18 | Disposition: A | Discharge status: Home / Self Care | Source: Ambulatory Visit | Attending: Interventional Radiology | Admitting: Interventional Radiology

## 2023-08-18 DIAGNOSIS — N281 Cyst of kidney, acquired: Secondary | ICD-10-CM

## 2023-08-18 NOTE — Progress Notes (Signed)
 Patient ID: Albert Andrews, male   DOB: [birth year], 73 y.o.   MRN: [id number]       Chief Complaint: Patient was seen in consultation today for f/u cryoablation R renal cystic mass at the request of Winter,Christopher Thurston Flow.   Referring Physician(s): Winter,Christopher Thurston Flow.   History of Present Illness: Albert Andrews is a 72 y.o. male  history of enlarging right  cystic renal neoplasm,  08/30/22 MR: small cystic renal cell carcinoma in the lower pole of the right kidney which currently measures 1.7 x 1.9 x 2.2 cm. This is encapsulated within Gerota's fascia and is well separated from the right renal vein which remains patent at this time. No definite lymphadenopathy or other signs of metastatic disease noted in the abdomen  03/13/23 MR: Unchanged mixed solid and cystic mass of the posterior inferior pole of the right kidney measuring in total 2.0 x 1.8 cm, including an enhancing internal solid nodular component measuring 1.2 x 0.6 cm. 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  No evidence of renal vein invasion, lymphadenopathy, or metastatic disease in the abdomen.  04/30/23   cryoablation and biopsy  He has done well post procedure. No site pain. Minimal hematuria has resolved. No new symptoms. Biopsy considered nondiagnostic as it showed only cyst components. He is here with his spouse.     Past Medical History:  Diagnosis Date   ALS (amyotrophic lateral sclerosis) (HCC)    Cancer of kidney (HCC)    Cirrhosis of liver (HCC)    DM type 2 (diabetes mellitus, type 2) (HCC)    GERD (gastroesophageal reflux disease)    Hyperlipidemia    Hypertension    Melanoma (HCC) 08/02/2020   removed from both legs areas healed    Prostate cancer (HCC)    Urinary frequency    Weakness of both lower extremities    due to dm uses power wc    Past Surgical History:  Procedure Laterality Date   ADENOIDECTOMY, TONSILLECTOMY AND MYRINGOTOMY WITH TUBE  PLACEMENT  as child   BIOPSY  01/07/2020   Procedure: BIOPSY;  Surgeon: Alvis Jourdain, MD;  Location: [facility];  Service: Endoscopy;;   COLONOSCOPY WITH PROPOFOL  N/A 01/26/2021   Procedure: COLONOSCOPY WITH PROPOFOL ;  Surgeon: Alvis Jourdain, MD;  Location: [facility];  Service: Endoscopy;  Laterality: N/A;   ESOPHAGOGASTRODUODENOSCOPY (EGD) WITH PROPOFOL  N/A 01/07/2020   Procedure: ESOPHAGOGASTRODUODENOSCOPY (EGD) WITH PROPOFOL ;  Surgeon: Alvis Jourdain, MD;  Location: [facility];  Service: Endoscopy;  Laterality: N/A;   POLYPECTOMY  01/26/2021   Procedure: POLYPECTOMY;  Surgeon: Alvis Jourdain, MD;  Location: [facility];  Service: Endoscopy;;   PROSTATE BIOPSY     RADIOACTIVE SEED IMPLANT N/A 09/01/2020   Procedure: RADIOACTIVE SEED IMPLANT/BRACHYTHERAPY IMPLANT;  Surgeon: Adelbert Homans, MD;  Location: [facility];  Service: Urology;  Laterality: N/A;   RADIOLOGY WITH ANESTHESIA N/A 04/30/2023   Procedure: CT CYRO ABLATION;  Surgeon: Marland Silvas, MD;  Location: [facility];  Service: Radiology;  Laterality: N/A;   ROTATOR CUFF REPAIR Left 1994   SPACE OAR INSTILLATION N/A 09/01/2020   Procedure: SPACE OAR INSTILLATION;  Surgeon: Adelbert Homans, MD;  Location: [facility];  Service: Urology;  Laterality: N/A;    Allergies: Dulaglutide, Jardiance [empagliflozin], and Metformin and related  Medications: Prior to Admission medications   Medication Sig Start Date End Date Taking? Authorizing Provider  Blood Glucose Monitoring Suppl (FREESTYLE FREEDOM LITE) w/Device KIT USE TO CHECK  GLUCOSE ONCE DAILY 05/02/18   [provider]  fexofenadine (ALLEGRA) 180 MG tablet Take 180 mg by mouth at bedtime.    [provider]  Insulin  Degludec (TRESIBA) 100 UNIT/ML SOLN Inject 50 Units into the skin 2 (two) times daily.    [provider]  losartan-hydrochlorothiazide (HYZAAR) 100-12.5 MG tablet Take 1 tablet by mouth  daily.    [provider]  meclizine (ANTIVERT) 12.5 MG tablet Take 12.5 mg by mouth at bedtime.    [provider]  NOVOLOG  FLEXPEN 100 UNIT/ML FlexPen Inject 5-10 Units into the skin 3 (three) times daily before meals. 03/12/20   [provider]  OVER THE COUNTER MEDICATION Take 1 tablet by mouth at bedtime. Mirica    [provider]  OZEMPIC, 1 MG/DOSE, 4 MG/3ML SOPN Inject 1 mg into the skin every Saturday. 04/06/23   [provider]  pantoprazole (PROTONIX) 20 MG tablet Take 20 mg by mouth daily.    [provider]  pregabalin (LYRICA) 100 MG capsule Take 100 mg by mouth 2 (two) times daily. 01/21/20 04/22/23  [provider]  simvastatin (ZOCOR) 10 MG tablet Take 10 mg by mouth every evening.     [provider]  tamsulosin (FLOMAX) 0.4 MG CAPS capsule Take 0.4 mg by mouth 2 (two) times daily.    [provider]     Family History  Problem Relation Age of Onset   Cancer Brother        unknown   Prostate cancer Neg Hx     Social History   Socioeconomic History   Marital status: Married    Spouse name: Not on file   Number of children: Not on file   Years of education: Not on file   Highest education level: Not on file  Occupational History    Comment: retired  Tobacco Use   Smoking status: Never   Smokeless tobacco: Never  Vaping Use   Vaping status: Never Used  Substance and Sexual Activity   Alcohol  use: Not Currently   Drug use: No   Sexual activity: Not Currently  Other Topics Concern   Not on file  Social History Narrative   Not on file   Social Drivers of Health   Financial Resource Strain: Not on file  Food Insecurity: No Food Insecurity (03/28/2020)   Hunger Vital Sign    Worried About Running Out of Food in the Last Year: Never true    Ran Out of Food in the Last Year: Never true  Transportation Needs: Not on file  Physical Activity: Not on file  Stress: Not on file  Social  Connections: Not on file    ECOG Status: 0 - Asymptomatic  Review of Systems: A 12 point ROS discussed and pertinent positives are indicated in the HPI above.  All other systems are negative.  Review of Systems  Vital Signs: There were no vitals taken for this visit.   Physical Exam Constitutional: Oriented to person, place, and time. Well-developed and well-nourished. No distress.   HENT:  Head: Normocephalic and atraumatic.  Eyes: Conjunctivae and EOM are normal. Right eye exhibits no discharge. Left eye exhibits no discharge. No scleral icterus.  Neck: No JVD present.  Pulmonary/Chest: Effort normal. No stridor. No respiratory distress.  Abdomen: soft, non distended Neurological:  alert and oriented to person, place, and time.  Skin: Skin is warm and dry.  not diaphoretic.  Psychiatric:   normal mood and affect.   behavior  is normal. Judgment and thought content normal.         Imaging: CT ABDOMEN W WO CONTRAST Result Date: 08/13/2023 CT ABDOMEN W WO CONTRAST 08/13/2023 6:40 AM CDT CLINICAL HISTORY: Male, 73 years old. Postablation scan. Omni 350 mL Post ablation technique used. Scan to f/u post ablation from 05/03/23. No NVC or fevers. No diarrhea. COMPARISON: MRI abdomen from 03/13/2023 PROCEDURE COMMENTS: CT of the abdomen was performed following uneventful administration of IV contrast. Oral contrast was not administered prior to the examination. CT was performed using one or more dose reduction techniques including automated exposure control, adjustment of the mA and/or kV according to patient size, and/or use of iterative reconstruction technique.Unless otherwise stated, incidental findings identified in this report do not require routine follow-up. 3-D reconstruction volume rendered imaging was performed on a separate workstation if necessary. Unless otherwise stated, incidental findings identified in this report do not require routine follow-up. FINDINGS: Lower thorax: Mild  degenerative changes in bilateral lung bases. No pleural or pericardial effusion. Heart is of normal size. Liver and biliary tree: Liver is normal in morphology. Low density cystic lesion identified within the liver parenchyma segment 8 measuring up to 1.8 cm. No focal liver lesions are identified. Hepatic, portal, and superior mesenteric veins are patent. No intra- or extrahepatic biliary dilatation. Gallbladder: Wall thickening or pericholecystic fluid. Spleen: Moderate splenomegaly measuring up to 18.1 cm in greatest craniocaudal dimension. Pancreas: Normal. Adrenal glands: Normal. Kidneys and ureters: Symmetric bilateral enhancement. No radiopaque renal calculi. Simple cyst identified in the right upper pole. Post treatment changes compatible with percutaneous thermal ablation of a right midpole renal mass, with the posterior uterine cavity measuring 2.1 x 2.4 cm. No suspicious or worrisome nodular enhancement is identified on contrast examination to suggest local recurrence or residual disease. No evidence of newly renal mass is appreciated. No hydronephrosis or obstructive uropathy. Gastrointestinal tract: No bowel obstruction or wall thickening. Peritoneal cavity: No free fluid or free air. Vasculature: Mild scattered calcified and noncalcified atherosclerotic calcifications involving the abdominal aorta. No abdominal aortic aneurysm, dissection or high-grade focal stenosis. IVC is patent. Lymph nodes: No pathologically enlarged lymphadenopathy in the abdomen or pelvis. Abdominal wall: Normal. Musculoskeletal: No suspicious osseous lesions. Mild multilevel degenerative changes of the mid to lower lumbar spine. IMPRESSION: 1. Postprocedural changes compatible with percutaneous thermal ablation of a right midpole renal mass without imaging findings of local recurrence or residual disease. Close continued attention on follow-up is advised. 2.  No new enhancing mass, hydronephrosis or obstructive uropathy. 3. No  pathologically enlarged lymphadenopathy or other imaging findings to suggest distal metastases within the upper abdomen. 4.  Cholelithiasis. Electronically signed by: Edrie Gower MD 08/13/2023 07:07 PM EDT RP Workstation: [id number]    Labs:  CBC: Recent Labs    04/25/23 1322  WBC 5.1  HGB 14.2  HCT 43.5  PLT 74*    COAGS: Recent Labs    04/25/23 1322  INR 1.3*    BMP: Recent Labs    04/25/23 1322  NA 136  K 3.7  CL 102  CO2 26  GLUCOSE 112*  BUN 19  CALCIUM  8.7*  CREATININE 0.76  GFRNONAA >60    LIVER FUNCTION TESTS: No results for input(s): "BILITOT", "AST", "ALT", "ALKPHOS", "PROT", "ALBUMIN" in the last 8760 hours.  TUMOR MARKERS: No results for input(s): "AFPTM", "CEA", "CA199", "CHROMGRNA" in the last 8760 hours.  Assessment and Plan:  My impression is that this patient is doing well post renal cryoablation. CT looks great  post procedure.  No evident complication. No activity restrictions. We will get followup scan in about 6-9 months, then recurring surveillance scans over lengthening intervals until stability x5 years is documented. He seemed to understand and had no questions at this time. Knows to call with questions or problems in the meantime.   Thank you for this interesting consult.  I greatly enjoyed meeting Albert Andrews and look forward to participating in their care.  A copy of this report was sent to the requesting provider on this date.  Electronically Signed: Dayne Mattingly Fountaine 08/18/2023, 8:49 AM   I spent a total of    25 Minutes in face to face in clinical consultation, greater than 50% of which was counseling/coordinating care for R renal lesion, post ablation.

## 2023-08-21 DIAGNOSIS — R262 Difficulty in walking, not elsewhere classified: Secondary | ICD-10-CM | POA: Diagnosis not present

## 2023-09-18 DIAGNOSIS — R262 Difficulty in walking, not elsewhere classified: Secondary | ICD-10-CM | POA: Diagnosis not present

## 2023-09-18 DIAGNOSIS — C61 Malignant neoplasm of prostate: Secondary | ICD-10-CM | POA: Diagnosis not present

## 2023-09-22 DIAGNOSIS — R262 Difficulty in walking, not elsewhere classified: Secondary | ICD-10-CM | POA: Diagnosis not present

## 2023-09-25 DIAGNOSIS — C61 Malignant neoplasm of prostate: Secondary | ICD-10-CM | POA: Diagnosis not present

## 2023-09-25 DIAGNOSIS — D49511 Neoplasm of unspecified behavior of right kidney: Secondary | ICD-10-CM | POA: Diagnosis not present

## 2023-10-01 DIAGNOSIS — R262 Difficulty in walking, not elsewhere classified: Secondary | ICD-10-CM | POA: Diagnosis not present

## 2023-10-08 DIAGNOSIS — R262 Difficulty in walking, not elsewhere classified: Secondary | ICD-10-CM | POA: Diagnosis not present

## 2023-10-15 DIAGNOSIS — R262 Difficulty in walking, not elsewhere classified: Secondary | ICD-10-CM | POA: Diagnosis not present

## 2023-10-20 DIAGNOSIS — E1165 Type 2 diabetes mellitus with hyperglycemia: Secondary | ICD-10-CM | POA: Diagnosis not present

## 2023-10-20 DIAGNOSIS — E781 Pure hyperglyceridemia: Secondary | ICD-10-CM | POA: Diagnosis not present

## 2023-10-22 DIAGNOSIS — R262 Difficulty in walking, not elsewhere classified: Secondary | ICD-10-CM | POA: Diagnosis not present

## 2023-10-27 DIAGNOSIS — K7460 Unspecified cirrhosis of liver: Secondary | ICD-10-CM | POA: Diagnosis not present

## 2023-10-27 DIAGNOSIS — C61 Malignant neoplasm of prostate: Secondary | ICD-10-CM | POA: Diagnosis not present

## 2023-10-27 DIAGNOSIS — I10 Essential (primary) hypertension: Secondary | ICD-10-CM | POA: Diagnosis not present

## 2023-10-27 DIAGNOSIS — E1165 Type 2 diabetes mellitus with hyperglycemia: Secondary | ICD-10-CM | POA: Diagnosis not present

## 2023-10-27 DIAGNOSIS — E162 Hypoglycemia, unspecified: Secondary | ICD-10-CM | POA: Diagnosis not present

## 2023-10-27 DIAGNOSIS — E781 Pure hyperglyceridemia: Secondary | ICD-10-CM | POA: Diagnosis not present

## 2023-10-27 DIAGNOSIS — G629 Polyneuropathy, unspecified: Secondary | ICD-10-CM | POA: Diagnosis not present

## 2023-10-29 DIAGNOSIS — R262 Difficulty in walking, not elsewhere classified: Secondary | ICD-10-CM | POA: Diagnosis not present

## 2023-11-06 DIAGNOSIS — R262 Difficulty in walking, not elsewhere classified: Secondary | ICD-10-CM | POA: Diagnosis not present

## 2023-11-19 DIAGNOSIS — R262 Difficulty in walking, not elsewhere classified: Secondary | ICD-10-CM | POA: Diagnosis not present

## 2023-11-25 DIAGNOSIS — L814 Other melanin hyperpigmentation: Secondary | ICD-10-CM | POA: Diagnosis not present

## 2023-11-25 DIAGNOSIS — D2262 Melanocytic nevi of left upper limb, including shoulder: Secondary | ICD-10-CM | POA: Diagnosis not present

## 2023-11-25 DIAGNOSIS — Z85820 Personal history of malignant melanoma of skin: Secondary | ICD-10-CM | POA: Diagnosis not present

## 2023-11-25 DIAGNOSIS — C44719 Basal cell carcinoma of skin of left lower limb, including hip: Secondary | ICD-10-CM | POA: Diagnosis not present

## 2023-11-25 DIAGNOSIS — C44712 Basal cell carcinoma of skin of right lower limb, including hip: Secondary | ICD-10-CM | POA: Diagnosis not present

## 2023-11-25 DIAGNOSIS — Z85828 Personal history of other malignant neoplasm of skin: Secondary | ICD-10-CM | POA: Diagnosis not present

## 2023-11-25 DIAGNOSIS — L570 Actinic keratosis: Secondary | ICD-10-CM | POA: Diagnosis not present

## 2023-11-25 DIAGNOSIS — D1801 Hemangioma of skin and subcutaneous tissue: Secondary | ICD-10-CM | POA: Diagnosis not present

## 2023-11-25 DIAGNOSIS — L821 Other seborrheic keratosis: Secondary | ICD-10-CM | POA: Diagnosis not present

## 2023-11-25 DIAGNOSIS — L720 Epidermal cyst: Secondary | ICD-10-CM | POA: Diagnosis not present

## 2023-11-25 DIAGNOSIS — D225 Melanocytic nevi of trunk: Secondary | ICD-10-CM | POA: Diagnosis not present

## 2023-11-25 DIAGNOSIS — D485 Neoplasm of uncertain behavior of skin: Secondary | ICD-10-CM | POA: Diagnosis not present

## 2023-12-03 DIAGNOSIS — Z85820 Personal history of malignant melanoma of skin: Secondary | ICD-10-CM | POA: Diagnosis not present

## 2023-12-03 DIAGNOSIS — C44712 Basal cell carcinoma of skin of right lower limb, including hip: Secondary | ICD-10-CM | POA: Diagnosis not present

## 2023-12-03 DIAGNOSIS — Z85828 Personal history of other malignant neoplasm of skin: Secondary | ICD-10-CM | POA: Diagnosis not present

## 2024-01-01 DIAGNOSIS — I10 Essential (primary) hypertension: Secondary | ICD-10-CM | POA: Diagnosis not present

## 2024-01-01 DIAGNOSIS — Z1331 Encounter for screening for depression: Secondary | ICD-10-CM | POA: Diagnosis not present

## 2024-01-01 DIAGNOSIS — Z23 Encounter for immunization: Secondary | ICD-10-CM | POA: Diagnosis not present

## 2024-01-01 DIAGNOSIS — K7460 Unspecified cirrhosis of liver: Secondary | ICD-10-CM | POA: Diagnosis not present

## 2024-01-01 DIAGNOSIS — D696 Thrombocytopenia, unspecified: Secondary | ICD-10-CM | POA: Diagnosis not present

## 2024-01-01 DIAGNOSIS — E785 Hyperlipidemia, unspecified: Secondary | ICD-10-CM | POA: Diagnosis not present

## 2024-01-01 DIAGNOSIS — C61 Malignant neoplasm of prostate: Secondary | ICD-10-CM | POA: Diagnosis not present

## 2024-01-01 DIAGNOSIS — M519 Unspecified thoracic, thoracolumbar and lumbosacral intervertebral disc disorder: Secondary | ICD-10-CM | POA: Diagnosis not present

## 2024-01-01 DIAGNOSIS — E1142 Type 2 diabetes mellitus with diabetic polyneuropathy: Secondary | ICD-10-CM | POA: Diagnosis not present

## 2024-01-01 DIAGNOSIS — R269 Unspecified abnormalities of gait and mobility: Secondary | ICD-10-CM | POA: Diagnosis not present

## 2024-01-01 DIAGNOSIS — Z0000 Encounter for general adult medical examination without abnormal findings: Secondary | ICD-10-CM | POA: Diagnosis not present

## 2024-01-01 DIAGNOSIS — N1831 Chronic kidney disease, stage 3a: Secondary | ICD-10-CM | POA: Diagnosis not present

## 2024-01-01 DIAGNOSIS — Z794 Long term (current) use of insulin: Secondary | ICD-10-CM | POA: Diagnosis not present

## 2024-01-01 DIAGNOSIS — Z85528 Personal history of other malignant neoplasm of kidney: Secondary | ICD-10-CM | POA: Diagnosis not present

## 2024-01-14 DIAGNOSIS — Z85820 Personal history of malignant melanoma of skin: Secondary | ICD-10-CM | POA: Diagnosis not present

## 2024-01-14 DIAGNOSIS — Z85828 Personal history of other malignant neoplasm of skin: Secondary | ICD-10-CM | POA: Diagnosis not present

## 2024-01-14 DIAGNOSIS — D2262 Melanocytic nevi of left upper limb, including shoulder: Secondary | ICD-10-CM | POA: Diagnosis not present

## 2024-01-14 DIAGNOSIS — D485 Neoplasm of uncertain behavior of skin: Secondary | ICD-10-CM | POA: Diagnosis not present

## 2024-03-01 DIAGNOSIS — E162 Hypoglycemia, unspecified: Secondary | ICD-10-CM | POA: Diagnosis not present

## 2024-03-01 DIAGNOSIS — G629 Polyneuropathy, unspecified: Secondary | ICD-10-CM | POA: Diagnosis not present

## 2024-03-01 DIAGNOSIS — E781 Pure hyperglyceridemia: Secondary | ICD-10-CM | POA: Diagnosis not present

## 2024-03-01 DIAGNOSIS — K7460 Unspecified cirrhosis of liver: Secondary | ICD-10-CM | POA: Diagnosis not present

## 2024-03-01 DIAGNOSIS — E1165 Type 2 diabetes mellitus with hyperglycemia: Secondary | ICD-10-CM | POA: Diagnosis not present

## 2024-03-01 DIAGNOSIS — C61 Malignant neoplasm of prostate: Secondary | ICD-10-CM | POA: Diagnosis not present

## 2024-03-01 DIAGNOSIS — I10 Essential (primary) hypertension: Secondary | ICD-10-CM | POA: Diagnosis not present

## 2024-03-05 DIAGNOSIS — R197 Diarrhea, unspecified: Secondary | ICD-10-CM | POA: Diagnosis not present

## 2024-03-30 ENCOUNTER — Encounter: Payer: Self-pay | Admitting: Physical Medicine & Rehabilitation

## 2024-05-10 ENCOUNTER — Encounter: Admitting: Physical Medicine & Rehabilitation

## 2024-05-28 ENCOUNTER — Encounter: Admitting: Physical Medicine & Rehabilitation
